# Patient Record
Sex: Female | Born: 1960 | Race: Black or African American | Hispanic: No | Marital: Married | Smoking: Never smoker
Health system: Southern US, Community
[De-identification: ages and names within clinical notes are randomized; demographics above are authoritative.]

## PROBLEM LIST (undated history)

## (undated) DIAGNOSIS — R569 Unspecified convulsions: Secondary | ICD-10-CM

## (undated) DIAGNOSIS — G40909 Epilepsy, unspecified, not intractable, without status epilepticus: Secondary | ICD-10-CM

## (undated) HISTORY — PX: CYSTECTOMY: SUR359

## (undated) HISTORY — DX: Epilepsy, unspecified, not intractable, without status epilepticus: G40.909

## (undated) HISTORY — PX: EYE SURGERY: SHX253

---

## 2015-04-13 ENCOUNTER — Encounter: Payer: Self-pay | Admitting: Obstetrics & Gynecology

## 2015-04-13 ENCOUNTER — Ambulatory Visit (INDEPENDENT_AMBULATORY_CARE_PROVIDER_SITE_OTHER): Payer: PRIVATE HEALTH INSURANCE | Admitting: Obstetrics & Gynecology

## 2015-04-13 VITALS — BP 130/78 | HR 73 | Resp 16 | Ht 62.0 in | Wt 131.0 lb

## 2015-04-13 DIAGNOSIS — N390 Urinary tract infection, site not specified: Secondary | ICD-10-CM

## 2015-04-13 DIAGNOSIS — Z01419 Encounter for gynecological examination (general) (routine) without abnormal findings: Secondary | ICD-10-CM

## 2015-04-13 DIAGNOSIS — Z1151 Encounter for screening for human papillomavirus (HPV): Secondary | ICD-10-CM

## 2015-04-13 DIAGNOSIS — Z124 Encounter for screening for malignant neoplasm of cervix: Secondary | ICD-10-CM | POA: Diagnosis not present

## 2015-04-13 LAB — POCT URINALYSIS DIPSTICK
Bilirubin, UA: NEGATIVE
GLUCOSE UA: NEGATIVE
Ketones, UA: NEGATIVE
NITRITE UA: NEGATIVE
Spec Grav, UA: 1.01
UROBILINOGEN UA: 0.2
pH, UA: 7

## 2015-04-13 MED ORDER — SULFAMETHOXAZOLE-TRIMETHOPRIM 800-160 MG PO TABS: 1.0000 | ORAL_TABLET | Freq: Two times a day (BID) | ORAL | Status: DC

## 2015-04-13 MED ORDER — FLUCONAZOLE 150 MG PO TABS: 150.0000 mg | ORAL_TABLET | Freq: Once | ORAL | Status: DC

## 2015-04-13 NOTE — Patient Instructions (Signed)

## 2015-04-13 NOTE — Progress Notes (Signed)
Patient ID: [place], female   DOB: [birth year], 54 y.o.   MRN: [id number] Pt here for routine GYN exam. Recently moved to this area from [place]. Last pap 2 years ago - normal. Never had abnormal pap. Current complaints today are UTI/Yeast infection Sx - used Monistat with no relief. Sx are lower abd pain, lower back pain, burning with urination, frequent urination. Urine dip shows Moderate blood and Leuks - will send for culture.

## 2015-04-13 NOTE — Progress Notes (Signed)
Patient ID: [place], female   DOB: Aug 11, 1961, 54 y.o.   MRN: [id number] Subjective:     H[place] is a 54 y.o. female here for a routine exam.  Current complaints: lower pelvic pressure and mild back pain for 3 weeks.  LMP: over 1 year ago.  Pt denies vaginal dryness. No mood changes.  Very mild hot flashes.    Gynecologic History No LMP recorded. Patient is postmenopausal. Contraception: post menopausal status Last Pap: 2 year. Results were: normal Last mammogram: 6 months prev. Results were: normal prev had cyst on breast and was having tests q 6 months.  Obstetric History OB History  Gravida Para Term Preterm AB SAB TAB Ectopic Multiple Living  2 2 2       2     # Outcome Date GA Lbr Len/2nd Weight Sex Delivery Anes PTL Lv  2 Term      CS-Unspec   Y  1 Term      Vag-Spont   Y     Past Medical History  Diagnosis Date  . Seizure disorder     Last one in 1994   Past Surgical History  Procedure Laterality Date  . Cystectomy Right     Right ear    Med: see current list  The following portions of the patient's history were reviewed and updated as appropriate: allergies, current medications, past family history, past medical history, past social history, past surgical history and problem list.  Review of Systems Pertinent items are noted in HPI.    Objective:    BP 130/78 mmHg  Pulse 73  Resp 16  Ht 5\' 2"  (1.575 m)  Wt 131 lb (59.421 kg)  BMI 23.95 kg/m2  General Appearance:    Alert, cooperative, no distress, appears stated age  Head:    Normocephalic, without obvious abnormality, atraumatic  Eyes:   both eyes  conjunctiva/corneas clear, EOM's intact,   Ears:    external ear canals normal, both ears  Nose:   Nares normal, septum midline  Throat:   Lips, mucosa, and tongue normal; teeth and gums normal  Neck:   Supple, symmetrical, trachea midline, no adenopathy;    thyroid:  no enlargement/tenderness/nodules  Back:     Symmetric, no curvature, ROM normal,  no CVA tenderness  Lungs:     Clear to auscultation bilaterally, respirations unlabored  Chest Wall:    No tenderness or deformity   Heart:    Regular rate and rhythm, S1 and S2 normal, no murmur, rub   or gallop  Breast Exam:    No tenderness, masses, or nipple abnormality  Abdomen:     Soft, non-tender, bowel sounds active all four quadrants,    no masses, no organomegaly  Genitalia:    Normal female without lesion, discharge or tenderness     Extremities:   Extremities normal, atraumatic, no cyanosis or edema  Pulses:   2+ and symmetric all extremities  Skin:   Skin color, texture, turgor normal, no rashes or lesions           Urine dipstick shows positive for RBC's and positive for leukocytes.     Assessment:    Healthy female exam.   UTI   Plan:    Follow up in: 1 year.    Bactrim DS 1 po bid x 3 days Mammogram next year Diflucan 1 po prn yeast infxn after atbx Urine cx sent  Alhaji Mcneal L. Harraway-Smith, M.D., [place]lynn June

## 2015-04-14 LAB — CYTOLOGY - PAP

## 2015-04-16 LAB — CULTURE, URINE COMPREHENSIVE: Colony Count: >=100000

## 2015-04-18 ENCOUNTER — Encounter: Payer: Self-pay | Admitting: Obstetrics & Gynecology

## 2015-05-03 ENCOUNTER — Ambulatory Visit (INDEPENDENT_AMBULATORY_CARE_PROVIDER_SITE_OTHER): Payer: PRIVATE HEALTH INSURANCE | Admitting: Primary Care

## 2015-05-03 ENCOUNTER — Ambulatory Visit (INDEPENDENT_AMBULATORY_CARE_PROVIDER_SITE_OTHER)
Admission: RE | Admit: 2015-05-03 | Discharge: 2015-05-03 | Disposition: A | Discharge status: Home / Self Care | Payer: PRIVATE HEALTH INSURANCE | Source: Ambulatory Visit | Attending: Primary Care | Admitting: Primary Care

## 2015-05-03 ENCOUNTER — Encounter: Payer: Self-pay | Admitting: Primary Care

## 2015-05-03 VITALS — BP 122/78 | HR 62 | Temp 97.8°F | Ht 62.0 in | Wt 129.4 lb

## 2015-05-03 DIAGNOSIS — Z8669 Personal history of other diseases of the nervous system and sense organs: Secondary | ICD-10-CM

## 2015-05-03 DIAGNOSIS — Z87898 Personal history of other specified conditions: Secondary | ICD-10-CM

## 2015-05-03 DIAGNOSIS — R109 Unspecified abdominal pain: Secondary | ICD-10-CM

## 2015-05-03 DIAGNOSIS — G40909 Epilepsy, unspecified, not intractable, without status epilepticus: Secondary | ICD-10-CM | POA: Insufficient documentation

## 2015-05-03 LAB — POCT URINALYSIS DIPSTICK
BILIRUBIN UA: NEGATIVE
GLUCOSE UA: NEGATIVE
KETONES UA: NEGATIVE
Leukocytes, UA: NEGATIVE
Nitrite, UA: NEGATIVE
Protein, UA: NEGATIVE
Urobilinogen, UA: NEGATIVE
pH, UA: 6

## 2015-05-03 NOTE — Addendum Note (Signed)
Addended by: Jacqualin Combes on: 05/03/2015 10:04 AM   Modules accepted: Orders

## 2015-05-03 NOTE — Patient Instructions (Addendum)
Complete lab work prior to leaving today. I will notify you of your results.  Complete xray(s) prior to leaving today. I will contact you regarding your results.  Please schedule a physical with me in the next 3 months. You will also schedule a lab only appointment one week prior. We will discuss your lab results during your physical.  It was a pleasure to meet you today! Please don't hesitate to call me with any questions. Welcome to [facility]!

## 2015-05-03 NOTE — Progress Notes (Signed)
Subjective:    Patient ID: Hannah [place], female    DOB: [birth year], 54 y.o.   MRN: [id number]  HPI  H[place] is a 54 year old female who presents today to establish care and discuss the problems mentioned below. Will obtain old records.  1) Urinary Tract Infection: She presented to her GYN 10 days and was diagnosed with a UTI. She was initiated on Bactrim DS tablets and completed the course. Overall her symptoms have subsided but she continues to experience bilateral low back pain with some nausea. She's been taking advil with relief. Denies fevers, dysuria, hematuria, frequency.  2) Seizures: Diagnosed in early 1990's. Last seizure in 1994 and has been managed on Tegretol since. She has not had tegretol levels checked in 2 years. She has not seen a neurologist in over 5 years. She would like to continue her Tegretol as she is nervous she will experience another seizure. Denies dizziness, headaches.  Review of Systems  Constitutional: Negative for unexpected weight change.  HENT: Negative for rhinorrhea.   Respiratory: Negative for cough and shortness of breath.   Cardiovascular: Negative for chest pain.  Gastrointestinal: Negative for diarrhea and constipation.  Genitourinary: Negative for difficulty urinating.  Musculoskeletal: Negative for myalgias and arthralgias.  Skin: Negative for rash.  Neurological: Negative for dizziness, numbness and headaches.  Psychiatric/Behavioral:       Denies concerns for anxiety or depression       Past Medical History  Diagnosis Date  . Seizure disorder     Last one in [place] History  . Marital Status: Married    Spouse Name: N/A  . Number of Children: N/A  . Years of Education: N/A   Occupational History  . Not on file.   Social History Main Topics  . Smoking status: Never Smoker   . Smokeless tobacco: Not on file  . Alcohol Use: No  . Drug Use: No  . Sexual Activity:    Partners: Male    Birth  Control/ Protection: Post-menopausal   Other Topics Concern  . Not on file   Social History Narrative   Moved from [place] to care for her mother.   Married.   2 daughters.   Highest level of education [employer].   Works doing taxes.    Enjoys exercising, dance, painting.    Past Surgical History  Procedure Laterality Date  . Cystectomy Right     Right ear     Family History  Problem Relation Age of Onset  . Hypertension Mother   . Diabetes Maternal Grandmother     Allergies  Allergen Reactions  . Penicillins Hives    Current Outpatient Prescriptions on File Prior to Visit  Medication Sig Dispense Refill  . Calcium Carbonate (CALCIUM 600 PO) Take by mouth 2 (two) times daily.    . carbamazepine (TEGRETOL) 100 MG chewable tablet Chew 100 mg by mouth 2 (two) times daily. 1 1/2 tabs daily    . cyanocobalamin 500 MCG tablet Take 500 mcg by mouth daily.    . Flaxseed, Linseed, (HM FLAXSEED OIL) 1000 MG CAPS Take by mouth.    . Multiple Vitamin (MULTIVITAMIN) tablet Take 1 tablet by mouth daily.    Marland Kitchen pyridoxine (B-6) 100 MG tablet Take 100 mg by mouth daily.    Marland Kitchen sulfamethoxazole-trimethoprim (BACTRIM DS,SEPTRA DS) 800-160 MG per tablet Take 1 tablet by mouth 2 (two) times daily. 10 tablet 0  . fluconazole (DIFLUCAN) 150  MG tablet Take 1 tablet (150 mg total) by mouth once. (Patient not taking: Reported on 05/03/2015) 1 tablet 0   No current facility-administered medications on file prior to visit.    BP 122/78 mmHg  Pulse 62  Temp(Src) 97.8 F (36.6 C) (Oral)  Ht 5\' 2"  (1.575 m)  Wt 129 lb 6.4 oz (58.695 kg)  BMI 23.66 kg/m2  SpO2 99%    Objective:   Physical Exam  Constitutional: She appears well-nourished.  Cardiovascular: Normal rate and regular rhythm.   Pulmonary/Chest: Effort normal and breath sounds normal.  Abdominal: Soft. Normal appearance and bowel sounds are normal. There is no tenderness. There is no CVA tenderness.  Skin: Skin is warm and  dry.          Assessment & Plan:  Flank pain:  UA negative for leuks, nitrites, glucose. Trace blood. Low back pain with nausea and UA with blood. Will obtain KUB to rule out renal stone. She is to notify me if pain becomes worse, develops fevers.

## 2015-05-03 NOTE — Assessment & Plan Note (Signed)
History of since 1990. Last seizure was in 1994.  She is managed currently on Tegretol and has been on since 1994. She would like to continue this medication as she is fearful of re-occuring seizure as she drives a lot. Discussed current guidelines regarding removal of medication. Will obtain tegretol level and refill medication. Will consider referral to neurology if needed.

## 2015-05-03 NOTE — Progress Notes (Signed)
Pre visit review using our clinic review tool, if applicable. No additional management support is needed unless otherwise documented below in the visit note. 

## 2015-05-04 LAB — URINE CULTURE

## 2015-05-04 LAB — CARBAMAZEPINE LEVEL, TOTAL: Carbamazepine Lvl: 4.5 mg/L (ref 4.0–12.0)

## 2015-05-05 ENCOUNTER — Other Ambulatory Visit: Payer: Self-pay | Admitting: Primary Care

## 2015-05-05 ENCOUNTER — Encounter: Payer: Self-pay | Admitting: Primary Care

## 2015-05-05 DIAGNOSIS — Z1231 Encounter for screening mammogram for malignant neoplasm of breast: Secondary | ICD-10-CM

## 2015-05-24 ENCOUNTER — Other Ambulatory Visit: Payer: Self-pay | Admitting: Primary Care

## 2015-05-24 DIAGNOSIS — Z1231 Encounter for screening mammogram for malignant neoplasm of breast: Secondary | ICD-10-CM

## 2015-05-26 ENCOUNTER — Ambulatory Visit
Admission: RE | Admit: 2015-05-26 | Discharge: 2015-05-26 | Disposition: A | Discharge status: Home / Self Care | Payer: No Typology Code available for payment source | Source: Ambulatory Visit | Attending: Primary Care | Admitting: Primary Care

## 2015-05-26 DIAGNOSIS — Z1231 Encounter for screening mammogram for malignant neoplasm of breast: Secondary | ICD-10-CM | POA: Diagnosis not present

## 2015-05-27 ENCOUNTER — Other Ambulatory Visit: Payer: Self-pay | Admitting: Primary Care

## 2015-05-27 ENCOUNTER — Encounter: Payer: Self-pay | Admitting: Primary Care

## 2015-05-27 DIAGNOSIS — Z87898 Personal history of other specified conditions: Secondary | ICD-10-CM

## 2015-05-27 MED ORDER — CARBAMAZEPINE 100 MG PO CHEW: CHEWABLE_TABLET | ORAL | Status: DC

## 2015-05-31 ENCOUNTER — Other Ambulatory Visit: Payer: Self-pay | Admitting: Internal Medicine

## 2015-05-31 DIAGNOSIS — R928 Other abnormal and inconclusive findings on diagnostic imaging of breast: Secondary | ICD-10-CM

## 2015-06-08 ENCOUNTER — Ambulatory Visit (INDEPENDENT_AMBULATORY_CARE_PROVIDER_SITE_OTHER): Payer: PRIVATE HEALTH INSURANCE

## 2015-06-08 DIAGNOSIS — Z23 Encounter for immunization: Secondary | ICD-10-CM

## 2015-06-13 ENCOUNTER — Ambulatory Visit
Admission: RE | Admit: 2015-06-13 | Discharge: 2015-06-13 | Disposition: A | Discharge status: Home / Self Care | Payer: No Typology Code available for payment source | Source: Ambulatory Visit | Attending: Internal Medicine | Admitting: Internal Medicine

## 2015-06-13 DIAGNOSIS — R928 Other abnormal and inconclusive findings on diagnostic imaging of breast: Secondary | ICD-10-CM

## 2015-06-13 DIAGNOSIS — N6489 Other specified disorders of breast: Secondary | ICD-10-CM | POA: Insufficient documentation

## 2015-06-17 ENCOUNTER — Other Ambulatory Visit: Payer: Self-pay | Admitting: Primary Care

## 2015-06-17 DIAGNOSIS — Z13220 Encounter for screening for lipoid disorders: Secondary | ICD-10-CM

## 2015-06-17 DIAGNOSIS — Z1321 Encounter for screening for nutritional disorder: Secondary | ICD-10-CM

## 2015-06-17 DIAGNOSIS — Z0000 Encounter for general adult medical examination without abnormal findings: Secondary | ICD-10-CM

## 2015-06-17 DIAGNOSIS — Z1329 Encounter for screening for other suspected endocrine disorder: Secondary | ICD-10-CM

## 2015-06-20 ENCOUNTER — Other Ambulatory Visit (INDEPENDENT_AMBULATORY_CARE_PROVIDER_SITE_OTHER): Payer: PRIVATE HEALTH INSURANCE

## 2015-06-20 DIAGNOSIS — Z13220 Encounter for screening for lipoid disorders: Secondary | ICD-10-CM

## 2015-06-20 DIAGNOSIS — Z1159 Encounter for screening for other viral diseases: Secondary | ICD-10-CM

## 2015-06-20 DIAGNOSIS — Z1329 Encounter for screening for other suspected endocrine disorder: Secondary | ICD-10-CM

## 2015-06-20 DIAGNOSIS — Z1321 Encounter for screening for nutritional disorder: Secondary | ICD-10-CM

## 2015-06-20 DIAGNOSIS — Z0000 Encounter for general adult medical examination without abnormal findings: Secondary | ICD-10-CM

## 2015-06-20 LAB — COMPREHENSIVE METABOLIC PANEL
ALBUMIN: 4.5 g/dL (ref 3.6–5.1)
ALK PHOS: 66 U/L (ref 33–130)
ALT: 18 U/L (ref 6–29)
AST: 21 U/L (ref 10–35)
BILIRUBIN TOTAL: 0.5 mg/dL (ref 0.2–1.2)
BUN: 14 mg/dL (ref 7–25)
CALCIUM: 9.3 mg/dL (ref 8.6–10.4)
CO2: 28 mmol/L (ref 20–31)
CREATININE: 0.71 mg/dL (ref 0.50–1.05)
Chloride: 101 mmol/L (ref 98–110)
GLUCOSE: 87 mg/dL (ref 65–99)
Potassium: 4.1 mmol/L (ref 3.5–5.3)
Sodium: 138 mmol/L (ref 135–146)
Total Protein: 7.4 g/dL (ref 6.1–8.1)

## 2015-06-20 LAB — LIPID PANEL
Cholesterol: 202 mg/dL — ABNORMAL HIGH (ref 125–200)
HDL: 72 mg/dL (ref >=46)
LDL Cholesterol: 120 mg/dL (ref <130)
Total CHOL/HDL Ratio: 2.8 Ratio (ref <=5.0)
Triglycerides: 50 mg/dL (ref <150)
VLDL: 10 mg/dL (ref <30)

## 2015-06-20 LAB — TSH: TSH: 0.853 u[IU]/mL (ref 0.350–4.500)

## 2015-06-21 LAB — VITAMIN D 25 HYDROXY (VIT D DEFICIENCY, FRACTURES): Vit D, 25-Hydroxy: 43 ng/mL (ref 30–100)

## 2015-06-21 LAB — HEPATITIS C ANTIBODY: HCV Ab: NEGATIVE

## 2015-06-23 ENCOUNTER — Ambulatory Visit (INDEPENDENT_AMBULATORY_CARE_PROVIDER_SITE_OTHER): Payer: PRIVATE HEALTH INSURANCE | Admitting: Primary Care

## 2015-06-23 ENCOUNTER — Encounter: Payer: Self-pay | Admitting: Primary Care

## 2015-06-23 ENCOUNTER — Other Ambulatory Visit (HOSPITAL_COMMUNITY)
Admission: RE | Admit: 2015-06-23 | Discharge: 2015-06-23 | Disposition: A | Discharge status: Home / Self Care | Payer: No Typology Code available for payment source | Source: Ambulatory Visit | Attending: Family Medicine | Admitting: Family Medicine

## 2015-06-23 ENCOUNTER — Other Ambulatory Visit: Payer: Self-pay | Admitting: Primary Care

## 2015-06-23 VITALS — BP 116/72 | HR 72 | Temp 98.2°F | Ht 62.0 in | Wt 129.8 lb

## 2015-06-23 DIAGNOSIS — E785 Hyperlipidemia, unspecified: Secondary | ICD-10-CM

## 2015-06-23 DIAGNOSIS — Z124 Encounter for screening for malignant neoplasm of cervix: Secondary | ICD-10-CM | POA: Diagnosis not present

## 2015-06-23 DIAGNOSIS — Z01419 Encounter for gynecological examination (general) (routine) without abnormal findings: Secondary | ICD-10-CM | POA: Insufficient documentation

## 2015-06-23 DIAGNOSIS — Z0000 Encounter for general adult medical examination without abnormal findings: Secondary | ICD-10-CM | POA: Insufficient documentation

## 2015-06-23 DIAGNOSIS — Z1211 Encounter for screening for malignant neoplasm of colon: Secondary | ICD-10-CM

## 2015-06-23 NOTE — Assessment & Plan Note (Addendum)
Tetanus and influenza UTD. Pap completed today. IFOB testing picked up by patient who will return soon. Discussed to start calcium with vitamin D as she is post menopausal.  Exam unremarkable. Labs with slight elevation of cholesterol. Discussed importance of healthy diet and exercise. Follow up in 1 year or sooner if needed.

## 2015-06-23 NOTE — Assessment & Plan Note (Signed)
TC slightly above goal at 202. Will have her work on improving diet and exercise. Will repeat 1 one year.

## 2015-06-23 NOTE — Addendum Note (Signed)
Addended by: Jacqualin Combes on: 06/23/2015 04:08 PM   Modules accepted: Orders

## 2015-06-23 NOTE — Progress Notes (Signed)
Pre visit review using our clinic review tool, if applicable. No additional management support is needed unless otherwise documented below in the visit note. 

## 2015-06-23 NOTE — Patient Instructions (Addendum)
Your cholesterol is slightly elevated. Work to increase fresh fruits and vegetables.   Increase water consumption. You need about 2 liters a day.  Continue your efforts of exercising.  Start taking a calcium and vitamin D supplement. You need 1200 mg of calcium and 800 units of vitamin D. This may be purchased over the counter.   Stop by the lab and pick up your stool kit. Bring it back as instructed.  Follow up in 1 year for repeat physical or sooner if needed.  It was a pleasure to see you today!

## 2015-06-23 NOTE — Addendum Note (Signed)
Addended by: Jacqualin Combes on: 06/23/2015 03:54 PM   Modules accepted: Orders

## 2015-06-23 NOTE — Addendum Note (Signed)
Addended by: Jacqualin Combes on: 06/23/2015 03:56 PM   Modules accepted: Orders

## 2015-06-23 NOTE — Progress Notes (Signed)
Subjective:    Patient ID: [place], female    DOB: 1961-08-11, 54 y.o.   MRN: [id number]  HPI  [place] is a 54 year old female who presents today for complete physical.  Immunizations: -Tetanus: Completed in May 2007. -Influenza: Completed in October 2016  Diet: Endorses a healthy diet: Breakfast: Egg, sausage, coffee, oatmeal Lunch: Wraps, left overs from dinner, yogurt Dinner: Chicken, fish, limited beef, vegetables Snacks: Vegetables, almonds Desserts: Occasionally Beverages: Limited water, coffee, sweet tea, some juice Exercise: She works out 3-4 days a week at [employer]. Eye exam: Completed 1 year ago.  Dental exam: Completed 1 year ago. Colonoscopy: Has never completed but has done annual IFOB testing. Dexa: Completed 2 years ago.  Pap Smear: Completed in 2013, due today. Mammogram: Completed in October 2016.   Review of Systems  HENT: Negative for rhinorrhea.   Respiratory: Negative for shortness of breath.   Cardiovascular: Negative for chest pain.  Gastrointestinal: Negative for diarrhea and constipation.  Genitourinary: Negative for difficulty urinating.  Musculoskeletal: Negative for myalgias and arthralgias.  Skin: Negative for rash.  Neurological: Negative for dizziness, numbness and headaches.  Psychiatric/Behavioral:       Denies concerns for anxiety or depression       Past Medical History  Diagnosis Date  . Seizure disorder ([place])     Last one in 1994    Social History   Social History  . Marital Status: Married    Spouse Name: N/A  . Number of Children: N/A  . Years of Education: N/A   Occupational History  . Not on file.   Social History Main Topics  . Smoking status: Never Smoker   . Smokeless tobacco: Not on file  . Alcohol Use: No  . Drug Use: No  . Sexual Activity:    Partners: Male    Birth Control/ Protection: Post-menopausal   Other Topics Concern  . Not on file   Social History Narrative   Moved from  [place] to care for her mother.   Married.   2 daughters.   Highest level of education [employer].   Works doing taxes.    Enjoys exercising, dance, painting.    Past Surgical History  Procedure Laterality Date  . Cystectomy Right     Right ear     Family History  Problem Relation Age of Onset  . Hypertension Mother   . Diabetes Maternal Grandmother   . Breast cancer Neg Hx     Allergies  Allergen Reactions  . Penicillins Hives    Current Outpatient Prescriptions on File Prior to Visit  Medication Sig Dispense Refill  . Calcium Carbonate (CALCIUM 600 PO) Take by mouth 2 (two) times daily.    . carbamazepine (TEGRETOL) 100 MG chewable tablet Chew 1 and 1/2 tablets by mouth daily. 60 tablet 5  . cyanocobalamin 500 MCG tablet Take 500 mcg by mouth daily.    . Flaxseed, Linseed, (HM FLAXSEED OIL) 1000 MG CAPS Take by mouth.    . Multiple Vitamin (MULTIVITAMIN) tablet Take 1 tablet by mouth daily.    Marland Kitchen pyridoxine (B-6) 100 MG tablet Take 100 mg by mouth daily.     No current facility-administered medications on file prior to visit.    BP 116/72 mmHg  Pulse 72  Temp(Src) 98.2 F (36.8 C) (Oral)  Ht 5\' 2"  (1.575 m)  Wt 129 lb 12.8 oz (58.877 kg)  BMI 23.73 kg/m2  SpO2 98%    Objective:  Physical Exam  Constitutional: She is oriented to person, place, and time. She appears well-nourished.  HENT:  Right Ear: Tympanic membrane and ear canal normal.  Left Ear: Tympanic membrane and ear canal normal.  Nose: Nose normal.  Mouth/Throat: Oropharynx is clear and moist.  Eyes: Conjunctivae and EOM are normal. Pupils are equal, round, and reactive to light.  Neck: Neck supple. No thyromegaly present.  Cardiovascular: Normal rate and regular rhythm.   Pulmonary/Chest: Effort normal and breath sounds normal.  Abdominal: Soft. Bowel sounds are normal.  Genitourinary: Cervix exhibits no motion tenderness and no discharge. Right adnexum displays no tenderness. Left  adnexum displays no tenderness. No vaginal discharge found.  Musculoskeletal: Normal range of motion.  Lymphadenopathy:    She has no cervical adenopathy.  Neurological: She is alert and oriented to person, place, and time. She has normal reflexes. No cranial nerve deficit.  Skin: Skin is warm and dry.  Psychiatric: She has a normal mood and affect.          Assessment & Plan:

## 2015-06-28 LAB — CYTOLOGY - PAP

## 2015-07-11 ENCOUNTER — Other Ambulatory Visit: Payer: Self-pay | Admitting: Primary Care

## 2015-07-11 DIAGNOSIS — Z1211 Encounter for screening for malignant neoplasm of colon: Secondary | ICD-10-CM

## 2015-07-13 ENCOUNTER — Other Ambulatory Visit (INDEPENDENT_AMBULATORY_CARE_PROVIDER_SITE_OTHER): Payer: No Typology Code available for payment source

## 2015-07-13 DIAGNOSIS — Z1211 Encounter for screening for malignant neoplasm of colon: Secondary | ICD-10-CM

## 2015-07-14 LAB — FECAL OCCULT BLOOD, IMMUNOCHEMICAL: Fecal Occult Blood: NEGATIVE

## 2015-07-18 ENCOUNTER — Encounter: Payer: Self-pay | Admitting: *Deleted

## 2015-12-19 ENCOUNTER — Ambulatory Visit (INDEPENDENT_AMBULATORY_CARE_PROVIDER_SITE_OTHER): Payer: No Typology Code available for payment source | Admitting: Primary Care

## 2015-12-19 ENCOUNTER — Ambulatory Visit (INDEPENDENT_AMBULATORY_CARE_PROVIDER_SITE_OTHER)
Admission: RE | Admit: 2015-12-19 | Discharge: 2015-12-19 | Disposition: A | Discharge status: Home / Self Care | Payer: PRIVATE HEALTH INSURANCE | Source: Ambulatory Visit | Attending: Primary Care | Admitting: Primary Care

## 2015-12-19 ENCOUNTER — Encounter: Payer: Self-pay | Admitting: Primary Care

## 2015-12-19 VITALS — BP 116/70 | HR 77 | Temp 97.8°F | Ht 62.0 in | Wt 126.8 lb

## 2015-12-19 DIAGNOSIS — M79645 Pain in left finger(s): Secondary | ICD-10-CM

## 2015-12-19 NOTE — Patient Instructions (Signed)
Complete xray(s) prior to leaving today. I will notify you of your results once received.  Try applying Aspercreme to the affected joint as needed. Tylenol or ibuprofen are okay to take as needed for discomfort.  Continue epsom salt soaks as needed.  Please notify me if no improvement in the next 4 weeks.  It was a pleasure to see you today!

## 2015-12-19 NOTE — Progress Notes (Signed)
Subjective:    Patient ID: [place], female    DOB: February 18, 1961, 55 y.o.   MRN: [id number]  HPI  [place] is a 55 year old female who presents today with a chief complaint of thumb pain and stiffness. Her pain and stiffness is located to the left thumb and has been present for for the past 6 weeks. She noticed this stiffness and pain gradually after lifting weights at the gym. She has been soaking her thumb in water and epsom salt three times daily with improvement. Her thumb became stiff again once she stopped soaking. Overall her symptoms are the same. Family history of arthritis in the hands from father. Denies recent injury/trauma, swelling to joints or hands, numbness.   Review of Systems  Constitutional: Negative for fever.  Musculoskeletal: Positive for arthralgias. Negative for joint swelling.  Neurological: Negative for numbness.       Past Medical History  Diagnosis Date  . Seizure disorder ([place])     Last one in 1994     Social History   Social History  . Marital Status: Married    Spouse Name: N/A  . Number of Children: N/A  . Years of Education: N/A   Occupational History  . Not on file.   Social History Main Topics  . Smoking status: Never Smoker   . Smokeless tobacco: Not on file  . Alcohol Use: No  . Drug Use: No  . Sexual Activity:    Partners: Male    Birth Control/ Protection: Post-menopausal   Other Topics Concern  . Not on file   Social History Narrative   Moved from [place] to care for her mother.   Married.   2 daughters.   Highest level of education [employer].   Works doing taxes.    Enjoys exercising, dance, painting.    Past Surgical History  Procedure Laterality Date  . Cystectomy Right     Right ear     Family History  Problem Relation Age of Onset  . Hypertension Mother   . Diabetes Maternal Grandmother   . Breast cancer Neg Hx     Allergies  Allergen Reactions  . Penicillins Hives    Current Outpatient  Prescriptions on File Prior to Visit  Medication Sig Dispense Refill  . Calcium Carbonate (CALCIUM 600 PO) Take by mouth 2 (two) times daily.    . carbamazepine (TEGRETOL) 100 MG chewable tablet Chew 1 and 1/2 tablets by mouth daily. (Patient taking differently: Chew 100 mg by mouth daily before breakfast. Chew 1 and 1/2 tablets by mouth daily.) 60 tablet 5  . cyanocobalamin 500 MCG tablet Take 500 mcg by mouth daily.    . Flaxseed, Linseed, (HM FLAXSEED OIL) 1000 MG CAPS Take by mouth.    . Multiple Vitamin (MULTIVITAMIN) tablet Take 1 tablet by mouth daily.    Marland Kitchen pyridoxine (B-6) 100 MG tablet Take 100 mg by mouth daily.     No current facility-administered medications on file prior to visit.    BP 116/70 mmHg  Pulse 77  Temp(Src) 97.8 F (36.6 C) (Oral)  Ht 5\' 2"  (1.575 m)  Wt 126 lb 12.8 oz (57.516 kg)  BMI 23.19 kg/m2  SpO2 99%    Objective:   Physical Exam  Constitutional: She appears well-nourished.  Cardiovascular: Normal rate and regular rhythm.   Pulmonary/Chest: Effort normal and breath sounds normal.  Musculoskeletal:  Slight decrease in flexion to left thumb. No swelling to joints or hand. 2+ radial  pulse. No obvious deformity.  Skin: Skin is warm and dry.          Assessment & Plan:  Thumb Pain:  Also with stiffness x 6 weeks. Xray of left thumb today unremarkable. No lesion or deformity. Exam with slight decrease in ROM with flexion, otherwise unremarkable. Discussed to refrain from heavy weight lifting, continue epsom salt soaks, tylenol or ibuprofen PRN. If no improvement, may need to consider PT. Suspect arthritis given FH.

## 2015-12-19 NOTE — Progress Notes (Signed)
Pre visit review using our clinic review tool, if applicable. No additional management support is needed unless otherwise documented below in the visit note. 

## 2016-01-19 ENCOUNTER — Encounter: Payer: Self-pay | Admitting: Primary Care

## 2016-03-17 ENCOUNTER — Encounter: Payer: Self-pay | Admitting: Primary Care

## 2016-05-10 ENCOUNTER — Emergency Department
Admission: EM | Admit: 2016-05-10 | Discharge: 2016-05-10 | Disposition: A | Discharge status: Home / Self Care | Payer: No Typology Code available for payment source | Attending: Student | Admitting: Student

## 2016-05-10 ENCOUNTER — Encounter: Payer: Self-pay | Admitting: Emergency Medicine

## 2016-05-10 DIAGNOSIS — Y999 Unspecified external cause status: Secondary | ICD-10-CM | POA: Diagnosis not present

## 2016-05-10 DIAGNOSIS — Y92002 Bathroom of unspecified non-institutional (private) residence single-family (private) house as the place of occurrence of the external cause: Secondary | ICD-10-CM | POA: Insufficient documentation

## 2016-05-10 DIAGNOSIS — S01111A Laceration without foreign body of right eyelid and periocular area, initial encounter: Secondary | ICD-10-CM | POA: Diagnosis not present

## 2016-05-10 DIAGNOSIS — S0990XA Unspecified injury of head, initial encounter: Secondary | ICD-10-CM

## 2016-05-10 DIAGNOSIS — W01198A Fall on same level from slipping, tripping and stumbling with subsequent striking against other object, initial encounter: Secondary | ICD-10-CM | POA: Insufficient documentation

## 2016-05-10 DIAGNOSIS — W19XXXA Unspecified fall, initial encounter: Secondary | ICD-10-CM

## 2016-05-10 DIAGNOSIS — Y9301 Activity, walking, marching and hiking: Secondary | ICD-10-CM | POA: Diagnosis not present

## 2016-05-10 DIAGNOSIS — Z23 Encounter for immunization: Secondary | ICD-10-CM | POA: Insufficient documentation

## 2016-05-10 MED ORDER — LIDOCAINE-EPINEPHRINE (PF) 1 %-1:200000 IJ SOLN
30.0000 mL | Freq: Once | INTRAMUSCULAR | Status: AC
  Administered 2016-05-10: 30 mL

## 2016-05-10 MED ORDER — TETANUS-DIPHTH-ACELL PERTUSSIS 5-2.5-18.5 LF-MCG/0.5 IM SUSP
0.5000 mL | Freq: Once | INTRAMUSCULAR | Status: AC
  Administered 2016-05-10: 0.5 mL via INTRAMUSCULAR
  Filled 2016-05-10: qty 0.5

## 2016-05-10 MED ORDER — LIDOCAINE-EPINEPHRINE (PF) 1 %-1:200000 IJ SOLN
INTRAMUSCULAR | Status: AC
  Administered 2016-05-10: 30 mL
  Filled 2016-05-10: qty 30

## 2016-05-10 MED ORDER — BACITRACIN ZINC 500 UNIT/GM EX OINT
TOPICAL_OINTMENT | CUTANEOUS | Status: AC
Start: 2016-05-10 — End: 2016-05-10
  Administered 2016-05-10: 1 via CUTANEOUS
  Filled 2016-05-10: qty 0.9

## 2016-05-10 MED ORDER — BACITRACIN ZINC 500 UNIT/GM EX OINT
TOPICAL_OINTMENT | CUTANEOUS | Status: AC
  Filled 2016-05-10: qty 0.9

## 2016-05-10 NOTE — ED Triage Notes (Signed)
Pt tripped over couch going to the bathroom fell hitting head on floor. Lac noted over right eyebrow, denies any loc.

## 2016-05-10 NOTE — ED Provider Notes (Signed)
[facility] Emergency Department Provider Note   ____________________________________________   First MD Initiated Contact with Patient 05/10/16 [phone]     (approximate)  I have reviewed the triage vital signs and the nursing notes.   HISTORY  Chief Complaint Laceration    HPI Hannah Richards is a 55 y.o. female with history of seizure disorder and hyperlipidemia, not chronically and evaluated who presents for evaluation of eyebrow laceration sustained after a mechanical fall which occurred suddenly just prior to arrival, constant since onset, mild to moderate, no modifying factors. Patient reports that she had fallen aslee on the couch and woke up to go to the pbathroom. She  was walking to the bathroom when she tripped over the end of the couch and fell onto the floor hitting the right side of her head. No loss of consciousness, no severe headache, no vomiting, no neck pain. She did sustain an laceration to the right eyebrow. She denies any chest pain or difficulty breathing, she is otherwise been in her usual state of health. She denies any alcohol or drug use. She is unsure of when she last received her tetanus vaccine. She denies any vision change or pain with extraocular movements.   Past Medical History:  Diagnosis Date  . Seizure disorder ([place])    Last one in 1994    Patient Active Problem List   Diagnosis Date Noted  . Hyperlipidemia 06/23/2015  . Preventative health care 06/23/2015  . Seizure disorder ([place]) 05/03/2015    Past Surgical History:  Procedure Laterality Date  . CYSTECTOMY Right    Right ear     Prior to Admission medications   Medication Sig Start Date End Date Taking? Authorizing Provider  Calcium Carbonate (CALCIUM 600 PO) Take by mouth 2 (two) times daily.    Historical Provider, MD  carbamazepine (TEGRETOL) 100 MG chewable tablet Chew 1 and 1/2 tablets by mouth daily. Patient taking differently: Chew 100 mg by mouth daily  before breakfast. Chew 1 and 1/2 tablets by mouth daily. 05/27/15   Pleas Koch, NP  cyanocobalamin 500 MCG tablet Take 500 mcg by mouth daily.    Historical Provider, MD  Flaxseed, Linseed, (HM FLAXSEED OIL) 1000 MG CAPS Take by mouth.    Historical Provider, MD  Multiple Vitamin (MULTIVITAMIN) tablet Take 1 tablet by mouth daily.    Historical Provider, MD  pyridoxine (B-6) 100 MG tablet Take 100 mg by mouth daily.    Historical Provider, MD    Allergies Penicillins  Family History  Problem Relation Age of Onset  . Hypertension Mother   . Diabetes Maternal Grandmother   . Breast cancer Neg Hx     Social History Social History  Substance Use Topics  . Smoking status: Never Smoker  . Smokeless tobacco: Not on file  . Alcohol use No    Review of Systems Constitutional: No fever/chills Eyes: No visual changes. ENT: No sore throat. Cardiovascular: Denies chest pain. Respiratory: Denies shortness of breath. Gastrointestinal: No abdominal pain.  No nausea, no vomiting.  No diarrhea.  No constipation. Genitourinary: Negative for dysuria. Musculoskeletal: Negative for back pain. Skin: Negative for rash. Neurological: Negative for headaches, focal weakness or numbness.  10-point ROS otherwise negative.  ____________________________________________   PHYSICAL EXAM:  Vitals:   05/10/16 0214 05/10/16 0215 05/10/16 0405  BP: (!) 135/41  139/72  Pulse: 75  63  Resp: 18  16  Temp: 98.1 F (36.7 C)    TempSrc: Oral  SpO2: 100%  100%  Weight:  128 lb (58.1 kg)   Height:  5\' 2"  (1.575 m)     VITAL SIGNS: ED Triage Vitals  Enc Vitals Group     BP 05/10/16 0214 (!) 135/41     Pulse Rate 05/10/16 0214 75     Resp 05/10/16 0214 18     Temp 05/10/16 0214 98.1 F (36.7 C)     Temp Source 05/10/16 0214 Oral     SpO2 05/10/16 0214 100 %     Weight 05/10/16 0215 128 lb (58.1 kg)     Height 05/10/16 0215 5\' 2"  (1.575 m)     Head Circumference --      Peak Flow --       Pain Score 05/10/16 0215 1     Pain Loc --      Pain Edu? --      Excl. in [place]? --     Constitutional: Alert and oriented. Well appearing and in no acute distress. Eyes: Conjunctivae are normal. PERRL. EOMI. Head: 1.5 cm deep gaping linear laceration just inferior to/associated with the right lateral eyebrow. No recurrent eyes or Battle sign. Nose: No congestion/rhinnorhea. Mouth/Throat: Mucous membranes are moist.  Oropharynx non-erythematous. Neck: No stridor.  No cervical spine tenderness to palpation. Cardiovascular: Normal rate, regular rhythm. Grossly normal heart sounds.  Good peripheral circulation. Respiratory: Normal respiratory effort.  No retractions. Lungs CTAB. Gastrointestinal: Soft and nontender. No distention.  No CVA tenderness. Genitourinary: deferred Musculoskeletal: No lower extremity tenderness nor edema.  No joint effusions. No midline T or L-spine tenderness to palpation. Neurologic:  Normal speech and language. No gross focal neurologic deficits are appreciated. No gait instability. 5 out of 5 strength in bilateral upper and lower extremity, sensation intact to light touch throughout. Skin:  Skin is warm, dry and intact. No rash noted. Psychiatric: Mood and affect are normal. Speech and behavior are normal.  ____________________________________________   LABS (all labs ordered are listed, but only abnormal results are displayed)  Labs Reviewed - No data to display ____________________________________________  EKG  none ____________________________________________  RADIOLOGY  none ____________________________________________   PROCEDURES  Procedure(s) performed:   LACERATION REPAIR Performed by: Joanne Gavel Authorized by: Loura Pardon A Consent: Verbal consent obtained. Risks and benefits: risks, benefits and alternatives were discussed Consent given by: patient Patient identity confirmed: provided demographic data Prepped and Draped in  normal sterile fashion Wound explored  Laceration Location: right eyebrow  Laceration Length: 1.5 cm  No Foreign Bodies seen or palpated  Anesthesia: local infiltration  Local anesthetic: lidocaine 1% with epinephrine  Anesthetic total: 4 ml  Irrigation method: syringe Amount of cleaning: standard  Skin closure: 1 x 4-0 vicryl deep stitch, 3 x 4-0 prolene stitches superficially placed  Number of sutures: 4 total  Technique: simple interrupted  Patient tolerance: Patient tolerated the procedure well with no immediate complications.  Procedures  Critical Care performed: No  ____________________________________________   INITIAL IMPRESSION / ASSESSMENT AND PLAN / ED COURSE  Pertinent labs & imaging results that were available during my care of the patient were reviewed by me and considered in my medical decision making (see chart for details).  Shauntavia Eckland is a 55 y.o. female with history of seizure disorder and hyperlipidemia, not chronically and evaluated who presents for evaluation of eyebrow laceration sustained after a mechanical fall which occurred suddenly just prior to arrival, constant since onset, mild to moderate, no modifying factors. On exam, she is very well-appearing  and in no acute distress, vital signs stable, she is afebrile. Laceration inferior to the right eyebrow repaired with good result. The remainder of her exam is atraumatic, she has an intact neurological exam. No indication for CT head according to canadian CT head rules. Tdap Given. Discussed return precautions and need for close PCP follow-up and she is comfortable with the discharge plan. DC home.  Clinical Course     ____________________________________________   FINAL CLINICAL IMPRESSION(S) / ED DIAGNOSES  Final diagnoses:  Head injury due to trauma, initial encounter  Fall, initial encounter  Laceration of right eyebrow, initial encounter      NEW MEDICATIONS STARTED DURING  THIS VISIT:  New Prescriptions   No medications on file     Note:  This document was prepared using Dragon voice recognition software and may include unintentional dictation errors.    Joanne Gavel, MD 05/10/16 [phone]

## 2016-05-10 NOTE — ED Notes (Signed)
PT states she got up to use restroom but it was dark and she tripped over couch. States hit head on hardwood floors, denies LOC, dizziness, or blurred vision. Pt is not complaining of any pain at this time. No distress noted.

## 2016-05-16 ENCOUNTER — Ambulatory Visit (INDEPENDENT_AMBULATORY_CARE_PROVIDER_SITE_OTHER): Payer: No Typology Code available for payment source | Admitting: Primary Care

## 2016-05-16 ENCOUNTER — Encounter: Payer: Self-pay | Admitting: Primary Care

## 2016-05-16 VITALS — BP 126/70 | HR 71 | Temp 98.1°F | Ht 62.0 in | Wt 130.1 lb

## 2016-05-16 DIAGNOSIS — Z4802 Encounter for removal of sutures: Secondary | ICD-10-CM

## 2016-05-16 NOTE — Progress Notes (Signed)
Pre visit review using our clinic review tool, if applicable. No additional management support is needed unless otherwise documented below in the visit note. 

## 2016-05-16 NOTE — Patient Instructions (Signed)
Apply antibiotic ointment daily for the next 5 days to prevent infection.  Please notify us if you're interested in Cologuard.  It was a pleasure to see you today!

## 2016-05-16 NOTE — Progress Notes (Signed)
Subjective:    Patient ID: Hannah Richards, female    DOB: 1960-12-12, 55 y.o.   MRN: [id number]  HPI  [name] is a 55 year old female who presents today for removal of sutures. She was evaluated at [facility] ED on 09/21 with a chief complaint of laceration. Her laceration is to the right eyebrow after sustaining a fall just prior to arrival. She was walking to the bathroom and tripped over the end of the couch hitting the floor on the right side of her head. She was provided with a Tdap and sutures (four total sutures) one,  4-0 vicryl deep stitch, and three, prolene stiches superficially placed.   She presents today for removal of sutures. She has noticed itching, and denies redness, drainage, fevers, headaches. She's been applying OTC antibiotic ointment daily.  Review of Systems  Constitutional: Negative for fever.  Skin: Negative for color change.  Neurological: Negative for dizziness and headaches.       Past Medical History:  Diagnosis Date  . Seizure disorder ([place])    Last one in 1994     Social History   Social History  . Marital status: Married    Spouse name: N/A  . Number of children: N/A  . Years of education: N/A   Occupational History  . Not on file.   Social History Main Topics  . Smoking status: Never Smoker  . Smokeless tobacco: Not on file  . Alcohol use No  . Drug use: No  . Sexual activity: Yes    Partners: Male    Birth control/ protection: Post-menopausal   Other Topics Concern  . Not on file   Social History Narrative   Moved from [place] to care for her mother.   Married.   2 daughters.   Highest level of education [employer].   Works doing taxes.    Enjoys exercising, dance, painting.    Past Surgical History:  Procedure Laterality Date  . CYSTECTOMY Right    Right ear     Family History  Problem Relation Age of Onset  . Hypertension Mother   . Diabetes Maternal Grandmother   . Breast cancer Neg Hx     Allergies    Allergen Reactions  . Penicillins Hives    Current Outpatient Prescriptions on File Prior to Visit  Medication Sig Dispense Refill  . Calcium Carbonate (CALCIUM 600 PO) Take by mouth 2 (two) times daily.    . cyanocobalamin 500 MCG tablet Take 500 mcg by mouth daily.    . Flaxseed, Linseed, (HM FLAXSEED OIL) 1000 MG CAPS Take by mouth.    . Multiple Vitamin (MULTIVITAMIN) tablet Take 1 tablet by mouth daily.    Marland Kitchen pyridoxine (B-6) 100 MG tablet Take 100 mg by mouth daily.     No current facility-administered medications on file prior to visit.     BP 126/70   Pulse 71   Temp 98.1 F (36.7 C) (Oral)   Ht 5\' 2"  (1.575 m)   Wt 130 lb 1.9 oz (59 kg)   SpO2 97%   BMI 23.80 kg/m    Objective:   Physical Exam  Constitutional: She is oriented to person, place, and time. She appears well-nourished.  Cardiovascular: Normal rate.   Pulmonary/Chest: Effort normal.  Neurological: She is alert and oriented to person, place, and time.  Skin: Skin is warm and dry. No erythema.  Well healing laceration to right eyebrow  Assessment & Plan:  Suture Removal:  4 sutures (1 deep, 3 superficial) placed to left eye brown on 09/21 after sustaining laceration. Site appears to be healing well, no erythema, tenderness. Denies headaches/dizziness since fall. 3 superficial sutures removed today. Cleansed site with betadine solution before and after suture removal.  Instructed patient to continue with application of antibiotic ointment. Follow up PRN.  Sheral Flow, NP

## 2016-06-15 ENCOUNTER — Other Ambulatory Visit: Payer: Self-pay | Admitting: Primary Care

## 2016-06-15 DIAGNOSIS — Z87898 Personal history of other specified conditions: Secondary | ICD-10-CM

## 2016-06-28 ENCOUNTER — Encounter: Payer: Self-pay | Admitting: Primary Care

## 2016-07-16 ENCOUNTER — Encounter: Payer: Self-pay | Admitting: Primary Care

## 2016-08-01 ENCOUNTER — Encounter: Payer: Self-pay | Admitting: Primary Care

## 2016-10-30 ENCOUNTER — Telehealth: Payer: Self-pay

## 2016-10-30 NOTE — Telephone Encounter (Signed)
Please notify patient that we are happy to help her with this and strongly encourage she schedule an annual physical as it's been over 2 years.

## 2016-10-30 NOTE — Telephone Encounter (Signed)
Pt left v/m requesting cb to see if pt could come by office to pick up  Colon screening test to [facility]. Pt request cb. Last annual 06/23/15; no future appt scheduled.Please advise.

## 2016-10-31 NOTE — Telephone Encounter (Signed)
Per DPR, detail message left for patient to return my call.

## 2016-11-02 ENCOUNTER — Other Ambulatory Visit: Payer: Self-pay | Admitting: Primary Care

## 2016-11-02 DIAGNOSIS — E785 Hyperlipidemia, unspecified: Secondary | ICD-10-CM

## 2016-11-02 NOTE — Telephone Encounter (Signed)
Spoken to patient and schedule physical. This colon screening will be addressed as well during the visit.

## 2016-11-05 ENCOUNTER — Other Ambulatory Visit (INDEPENDENT_AMBULATORY_CARE_PROVIDER_SITE_OTHER): Payer: No Typology Code available for payment source

## 2016-11-05 DIAGNOSIS — E785 Hyperlipidemia, unspecified: Secondary | ICD-10-CM

## 2016-11-05 LAB — COMPREHENSIVE METABOLIC PANEL
ALK PHOS: 55 U/L (ref 33–130)
ALT: 14 U/L (ref 6–29)
AST: 18 U/L (ref 10–35)
Albumin: 4.1 g/dL (ref 3.6–5.1)
BILIRUBIN TOTAL: 0.3 mg/dL (ref 0.2–1.2)
BUN: 14 mg/dL (ref 7–25)
CO2: 23 mmol/L (ref 20–31)
Calcium: 9.1 mg/dL (ref 8.6–10.4)
Chloride: 106 mmol/L (ref 98–110)
Creat: 0.74 mg/dL (ref 0.50–1.05)
GLUCOSE: 91 mg/dL (ref 65–99)
Potassium: 4 mmol/L (ref 3.5–5.3)
Sodium: 141 mmol/L (ref 135–146)
TOTAL PROTEIN: 6.8 g/dL (ref 6.1–8.1)

## 2016-11-05 LAB — LIPID PANEL
Cholesterol: 162 mg/dL (ref <200)
HDL: 59 mg/dL (ref >50)
LDL Cholesterol: 94 mg/dL (ref <100)
Total CHOL/HDL Ratio: 2.7 Ratio (ref <5.0)
Triglycerides: 43 mg/dL (ref <150)
VLDL: 9 mg/dL (ref <30)

## 2016-11-14 ENCOUNTER — Encounter: Payer: Self-pay | Admitting: Primary Care

## 2016-11-14 ENCOUNTER — Ambulatory Visit (INDEPENDENT_AMBULATORY_CARE_PROVIDER_SITE_OTHER): Payer: No Typology Code available for payment source | Admitting: Primary Care

## 2016-11-14 VITALS — BP 116/66 | HR 44 | Temp 98.2°F | Ht 62.0 in | Wt 130.0 lb

## 2016-11-14 DIAGNOSIS — Z0000 Encounter for general adult medical examination without abnormal findings: Secondary | ICD-10-CM | POA: Diagnosis not present

## 2016-11-14 DIAGNOSIS — E785 Hyperlipidemia, unspecified: Secondary | ICD-10-CM

## 2016-11-14 DIAGNOSIS — Z1211 Encounter for screening for malignant neoplasm of colon: Secondary | ICD-10-CM

## 2016-11-14 DIAGNOSIS — G40909 Epilepsy, unspecified, not intractable, without status epilepticus: Secondary | ICD-10-CM | POA: Diagnosis not present

## 2016-11-14 NOTE — Progress Notes (Signed)
Subjective:    Patient ID: Hannah Richards, female    DOB: 08/16/1961, 56 y.o.   MRN: [id number]  HPI  Hannah Richards is a 56 year old female who presents today for complete physical.  Immunizations: -Tetanus: Completed in 2017 -Influenza: Completed in Fall 2017   Diet: She endorses a fair diet. Breakfast: Egg, sausage, protein drink, oatmeal Lunch: Rice cake with apple butter/hummus, protein drink Dinner: Pasta, meat, vegetables, starch, salad Snacks: Nuts, seeds, occasional cookie Desserts: Occasionally Beverages: Water with juice  Exercise: She goes to the gym twice weekly (yoga, classes, walking) Eye exam: Completed in 2017 Dental exam: Completes semi-annually Colonoscopy: Never completed, has historically completed hemoccult stool cards Pap Smear: Completed in 06/2015 Mammogram: Completed in November 2017 Hep C Screening: Negative in 2016   Review of Systems  Constitutional: Negative for unexpected weight change.  HENT: Negative for rhinorrhea.   Respiratory: Negative for cough and shortness of breath.   Cardiovascular: Negative for chest pain.  Gastrointestinal: Negative for constipation and diarrhea.  Genitourinary: Negative for difficulty urinating and menstrual problem.  Musculoskeletal: Negative for arthralgias and myalgias.  Skin: Negative for rash.  Allergic/Immunologic: Negative for environmental allergies.  Neurological: Negative for dizziness, numbness and headaches.  Psychiatric/Behavioral:       Attending a support group for those who have family members with Dementia. Doing well.       Past Medical History:  Diagnosis Date  . Seizure disorder ([place])    Last one in 1994     Social History   Social History  . Marital status: Married    Spouse name: N/A  . Number of children: N/A  . Years of education: N/A   Occupational History  . Not on file.   Social History Main Topics  . Smoking status: Never Smoker  . Smokeless tobacco: Never Used  .  Alcohol use No  . Drug use: No  . Sexual activity: Yes    Partners: Male    Birth control/ protection: Post-menopausal   Other Topics Concern  . Not on file   Social History Narrative   Moved from [place] to care for her mother.   Married.   2 daughters.   Highest level of education [employer].   Works doing taxes.    Enjoys exercising, dance, painting.    Past Surgical History:  Procedure Laterality Date  . CYSTECTOMY Right    Right ear     Family History  Problem Relation Age of Onset  . Hypertension Mother   . Diabetes Maternal Grandmother   . Breast cancer Neg Hx     Allergies  Allergen Reactions  . Penicillins Hives    Current Outpatient Prescriptions on File Prior to Visit  Medication Sig Dispense Refill  . Calcium Carbonate (CALCIUM 600 PO) Take by mouth 2 (two) times daily.    . cyanocobalamin 500 MCG tablet Take 500 mcg by mouth daily.    . Flaxseed, Linseed, (HM FLAXSEED OIL) 1000 MG CAPS Take by mouth.    . Multiple Vitamin (MULTIVITAMIN) tablet Take 1 tablet by mouth daily.    Marland Kitchen pyridoxine (B-6) 100 MG tablet Take 100 mg by mouth daily.     No current facility-administered medications on file prior to visit.     BP (!) 166/66   Pulse (!) 44   Temp 98.2 F (36.8 C) (Oral)   Ht 5\' 2"  (1.575 m)   Wt 130 lb (59 kg)   SpO2 99%   BMI 23.78 kg/m  Objective:   Physical Exam  Constitutional: She is oriented to person, place, and time. She appears well-nourished.  HENT:  Right Ear: Tympanic membrane and ear canal normal.  Left Ear: Tympanic membrane and ear canal normal.  Nose: Nose normal.  Mouth/Throat: Oropharynx is clear and moist.  Eyes: Conjunctivae and EOM are normal. Pupils are equal, round, and reactive to light.  Neck: Neck supple. No thyromegaly present.  Cardiovascular: Normal rate and regular rhythm.   No murmur heard. Pulmonary/Chest: Effort normal and breath sounds normal. She has no rales.  Abdominal: Soft. Bowel sounds  are normal. There is no tenderness.  Musculoskeletal: Normal range of motion.  Lymphadenopathy:    She has no cervical adenopathy.  Neurological: She is alert and oriented to person, place, and time. She has normal reflexes. No cranial nerve deficit.  Skin: Skin is warm and dry. No rash noted.  Psychiatric: She has a normal mood and affect.          Assessment & Plan:

## 2016-11-14 NOTE — Assessment & Plan Note (Signed)
Immunizations UTD. Pap and Mammogram UTD. Due for colon cancer screening, elects for stool cards which are pending. Encouraged healthy lifestyle through exercise and diet.  Exam unremarkable. Labs unremarkable. Follow up in 1 year for annual exam.

## 2016-11-14 NOTE — Assessment & Plan Note (Signed)
No seizures since 1990's. Continues to take tegretol per her preference. Continue same.

## 2016-11-14 NOTE — Patient Instructions (Signed)
Stop by the lab for your stool specimen cards.  Continue exercising. You should be getting 150 minutes of moderate intensity exercise weekly.  Continue your efforts towards a healthy diet. Increase vegetables, fruit, whole grains.  Ensure you are consuming 64 ounces of water daily.  You are due for your mammogram in November 2018.  Follow up in 1 year for your annual exam or sooner if needed.  It was a pleasure to see you today!

## 2016-11-14 NOTE — Progress Notes (Signed)
Pre visit review using our clinic review tool, if applicable. No additional management support is needed unless otherwise documented below in the visit note. 

## 2016-11-14 NOTE — Assessment & Plan Note (Signed)
Lipids stable. Continue to monitor.

## 2016-11-15 ENCOUNTER — Encounter: Payer: Self-pay | Admitting: Primary Care

## 2016-11-15 NOTE — Telephone Encounter (Signed)
Hannah Richards, please see My Chart message regarding BP. I believe her paper said 403 systolic, is that correct or was it really 166? Please notify patient and correct the reading in her chart. Thanks.

## 2016-11-25 ENCOUNTER — Encounter: Payer: Self-pay | Admitting: Primary Care

## 2016-12-04 ENCOUNTER — Encounter: Payer: Self-pay | Admitting: Primary Care

## 2016-12-07 ENCOUNTER — Telehealth: Payer: Self-pay | Admitting: Primary Care

## 2016-12-07 NOTE — Telephone Encounter (Signed)
Please notify patient that her hemoocult result came back positive for blood detection. Has she noticed any rectal bleeding? Does she have any hemorrhoids? Based off of this positive reading she needs to have a colonoscopy. Is she agreeable?

## 2016-12-10 NOTE — Telephone Encounter (Signed)
Message left for patient to return my call.  

## 2016-12-11 NOTE — Telephone Encounter (Signed)
Spoken to patient. She stated that no rectal bleeding and no hemorrhoids. Patient stated that she bileve she may have sore from wiping too much.  Patient stated that she want to think about. She will call back regarding her decision.

## 2016-12-11 NOTE — Telephone Encounter (Signed)
Pt returned your call.  

## 2016-12-11 NOTE — Telephone Encounter (Signed)
Noted  

## 2016-12-11 NOTE — Telephone Encounter (Signed)
Pt returned your call Best number [phone]

## 2016-12-24 ENCOUNTER — Encounter: Payer: Self-pay | Admitting: Primary Care

## 2016-12-24 DIAGNOSIS — Z1211 Encounter for screening for malignant neoplasm of colon: Secondary | ICD-10-CM

## 2017-01-02 ENCOUNTER — Telehealth: Payer: Self-pay | Admitting: Gastroenterology

## 2017-01-02 NOTE — Telephone Encounter (Signed)
Patient is returning your call regarding a colonoscopy °

## 2017-01-03 ENCOUNTER — Other Ambulatory Visit: Payer: Self-pay

## 2017-01-03 ENCOUNTER — Telehealth: Payer: Self-pay

## 2017-01-03 DIAGNOSIS — Z1211 Encounter for screening for malignant neoplasm of colon: Secondary | ICD-10-CM

## 2017-01-03 NOTE — Telephone Encounter (Signed)
Pt has been scheduled screening colonoscopy with Dr. Allen Norris 01/22/17 Tuesday at [facility].  Gastroenterology Pre-Procedure Review  Request Date: 01/22/17 Requesting Physician: Dr. Allen Norris  PATIENT REVIEW QUESTIONS: The patient responded to the following health history questions as indicated:    1. Are you having any GI issues? no 2. Do you have a personal history of Polyps? no 3. Do you have a family history of Colon Cancer or Polyps? no 4. Diabetes Mellitus? no 5. Joint replacements in the past 12 months?no 6. Major health problems in the past 3 months?no 7. Any artificial heart valves, MVP, or defibrillator?no    MEDICATIONS & ALLERGIES:    Patient reports the following regarding taking any anticoagulation/antiplatelet therapy:   Plavix, Coumadin, Eliquis, Xarelto, Lovenox, Pradaxa, Brilinta, or Effient? no Aspirin? no  Patient confirms/reports the following medications:  Current Outpatient Prescriptions  Medication Sig Dispense Refill  . Calcium Carbonate (CALCIUM 600 PO) Take by mouth 2 (two) times daily.    . carbamazepine (TEGRETOL) 100 MG chewable tablet     . cyanocobalamin 500 MCG tablet Take 500 mcg by mouth daily.    . Flaxseed, Linseed, (HM FLAXSEED OIL) 1000 MG CAPS Take by mouth.    . Multiple Vitamin (MULTIVITAMIN) tablet Take 1 tablet by mouth daily.    Marland Kitchen pyridoxine (B-6) 100 MG tablet Take 100 mg by mouth daily.     No current facility-administered medications for this visit.     Patient confirms/reports the following allergies:  Allergies  Allergen Reactions  . Penicillins Hives    No orders of the defined types were placed in this encounter.   AUTHORIZATION INFORMATION Primary Insurance: 1D#: Group #:  Secondary Insurance: 1D#: Group #:  SCHEDULE INFORMATION: Date: 01/22/17 Dr. Allen Norris Time: Location: [place]

## 2017-01-04 ENCOUNTER — Telehealth: Payer: Self-pay | Admitting: Gastroenterology

## 2017-01-04 NOTE — Telephone Encounter (Signed)
01/04/17 11:22 am  Spoke with Adonna at [facility] and NO prior auth required for Screening Colonoscopy [phone] / Z12.11.

## 2017-01-21 ENCOUNTER — Encounter: Payer: Self-pay | Admitting: *Deleted

## 2017-01-22 ENCOUNTER — Ambulatory Visit: Payer: No Typology Code available for payment source | Admitting: Anesthesiology

## 2017-01-22 ENCOUNTER — Encounter: Payer: Self-pay | Admitting: *Deleted

## 2017-01-22 ENCOUNTER — Ambulatory Visit
Admission: RE | Admit: 2017-01-22 | Discharge: 2017-01-22 | Disposition: A | Discharge status: Home / Self Care | Payer: No Typology Code available for payment source | Source: Ambulatory Visit | Attending: Gastroenterology | Admitting: Gastroenterology

## 2017-01-22 ENCOUNTER — Encounter
Admission: RE | Disposition: A | Discharge status: Home / Self Care | Payer: Self-pay | Source: Ambulatory Visit | Attending: Gastroenterology

## 2017-01-22 DIAGNOSIS — G40909 Epilepsy, unspecified, not intractable, without status epilepticus: Secondary | ICD-10-CM | POA: Insufficient documentation

## 2017-01-22 DIAGNOSIS — Z79899 Other long term (current) drug therapy: Secondary | ICD-10-CM | POA: Diagnosis not present

## 2017-01-22 DIAGNOSIS — D125 Benign neoplasm of sigmoid colon: Secondary | ICD-10-CM | POA: Insufficient documentation

## 2017-01-22 DIAGNOSIS — Z1211 Encounter for screening for malignant neoplasm of colon: Secondary | ICD-10-CM | POA: Diagnosis present

## 2017-01-22 DIAGNOSIS — K635 Polyp of colon: Secondary | ICD-10-CM

## 2017-01-22 HISTORY — PX: COLONOSCOPY WITH PROPOFOL: SHX5780

## 2017-01-22 HISTORY — DX: Unspecified convulsions: R56.9

## 2017-01-22 SURGERY — COLONOSCOPY WITH PROPOFOL
Anesthesia: General

## 2017-01-22 MED ORDER — PROPOFOL 10 MG/ML IV BOLUS
INTRAVENOUS | Status: DC | PRN
  Administered 2017-01-22: 10 mg via INTRAVENOUS
  Administered 2017-01-22: 30 mg via INTRAVENOUS

## 2017-01-22 MED ORDER — PROPOFOL 500 MG/50ML IV EMUL
INTRAVENOUS | Status: DC | PRN
Start: 2017-01-22 — End: 2017-01-22
  Administered 2017-01-22: 50 ug/kg/min via INTRAVENOUS

## 2017-01-22 MED ORDER — MIDAZOLAM HCL 2 MG/2ML IJ SOLN
INTRAMUSCULAR | Status: AC
  Filled 2017-01-22: qty 2

## 2017-01-22 MED ORDER — SODIUM CHLORIDE 0.9 % IV SOLN
INTRAVENOUS | Status: DC
  Administered 2017-01-22: 09:00:00 via INTRAVENOUS

## 2017-01-22 MED ORDER — MIDAZOLAM HCL 5 MG/5ML IJ SOLN
INTRAMUSCULAR | Status: DC | PRN
  Administered 2017-01-22: 2 mg via INTRAVENOUS

## 2017-01-22 MED ORDER — PROPOFOL 500 MG/50ML IV EMUL
INTRAVENOUS | Status: AC
  Filled 2017-01-22: qty 50

## 2017-01-22 MED ORDER — FENTANYL CITRATE (PF) 100 MCG/2ML IJ SOLN
INTRAMUSCULAR | Status: DC | PRN
Start: 2017-01-22 — End: 2017-01-22
  Administered 2017-01-22 (×2): 50 ug via INTRAVENOUS

## 2017-01-22 MED ORDER — EPHEDRINE SULFATE 50 MG/ML IJ SOLN
INTRAMUSCULAR | Status: DC | PRN
  Administered 2017-01-22 (×2): 10 mg via INTRAVENOUS

## 2017-01-22 MED ORDER — FENTANYL CITRATE (PF) 100 MCG/2ML IJ SOLN
INTRAMUSCULAR | Status: AC
  Filled 2017-01-22: qty 2

## 2017-01-22 MED ORDER — LIDOCAINE HCL (PF) 2 % IJ SOLN
INTRAMUSCULAR | Status: AC
  Filled 2017-01-22: qty 2

## 2017-01-22 MED ORDER — LIDOCAINE HCL (PF) 2 % IJ SOLN
INTRAMUSCULAR | Status: DC | PRN
  Administered 2017-01-22: 50 mg

## 2017-01-22 NOTE — Brief Op Note (Signed)
Descending colon polyp cbx not retrieved

## 2017-01-22 NOTE — Op Note (Signed)
[facility] Gastroenterology Patient Name: Hannah Richards Procedure Date: 01/22/2017 9:14 AM MRN: [id number] Account #: [id number] Date of Birth: 04-19-1961 Admit Type: Inpatient Age: 56 Room: [facility] ENDO ROOM 4 Gender: Female Note Status: Finalized Procedure:            Colonoscopy Indications:          Screening for colorectal malignant neoplasm Providers:            Lucilla Lame MD, MD Referring MD:         Pleas Koch (Referring MD) Medicines:            Propofol per Anesthesia Complications:        No immediate complications. Procedure:            Pre-Anesthesia Assessment:                       - Prior to the procedure, a History and Physical was                        performed, and patient medications and allergies were                        reviewed. The patient's tolerance of previous                        anesthesia was also reviewed. The risks and benefits of                        the procedure and the sedation options and risks were                        discussed with the patient. All questions were                        answered, and informed consent was obtained. Prior                        Anticoagulants: The patient has taken no previous                        anticoagulant or antiplatelet agents. ASA Grade                        Assessment: II - A patient with mild systemic disease.                        After reviewing the risks and benefits, the patient was                        deemed in satisfactory condition to undergo the                        procedure.                       After obtaining informed consent, the colonoscope was                        passed under direct vision. Throughout the procedure,  the patient's blood pressure, pulse, and oxygen                        saturations were monitored continuously. The                        Colonoscope was introduced through the anus and         advanced to the the cecum, identified by appendiceal                        orifice and ileocecal valve. The colonoscopy was                        performed without difficulty. The patient tolerated the                        procedure well. The quality of the bowel preparation                        was excellent. Findings:      The perianal and digital rectal examinations were normal.      A 3 mm polyp was found in the sigmoid colon. The polyp was sessile. The       polyp was removed with a cold biopsy forceps. Resection and retrieval       were complete.      A 3 mm polyp was found in the sigmoid colon. The polyp was sessile. The       polyp was removed with a cold biopsy forceps. Resection was complete,       but the polyp tissue was not retrieved. Impression:           - One 3 mm polyp in the sigmoid colon, removed with a                        cold biopsy forceps. Resected and retrieved.                       - One 3 mm polyp in the sigmoid colon, removed with a                        cold biopsy forceps. Complete resection. Polyp tissue                        not retrieved. Recommendation:       - Discharge patient to home.                       - Resume previous diet.                       - Continue present medications.                       - Await pathology results.                       - Repeat colonoscopy in 5 years if polyp adenoma and 10                        years if hyperplastic Procedure Code(s):    ---  Professional ---                       [phone], Colonoscopy, flexible; with biopsy, single or                        multiple Diagnosis Code(s):    --- Professional ---                       Z12.11, Encounter for screening for malignant neoplasm                        of colon                       D12.5, Benign neoplasm of sigmoid colon CPT copyright 2016 American Medical Association. All rights reserved. The codes documented in this report are preliminary and  upon coder review may  be revised to meet current compliance requirements. Lucilla Lame MD, MD 01/22/2017 9:36:47 AM This report has been signed electronically. Number of Addenda: 0 Note Initiated On: 01/22/2017 9:14 AM Scope Withdrawal Time: 0 hours 8 minutes 54 seconds  Total Procedure Duration: 0 hours 12 minutes 49 seconds       [facility]

## 2017-01-22 NOTE — H&P (Signed)
   Lucilla Lame, MD [facility] [address] [place], [place] [address] Phone: [phone] Fax : [phone]  Primary Care Physician:  Pleas Koch, NP Primary Gastroenterologist:  Dr. Allen Norris  Pre-Procedure History & Physical: HPI:  Hannah Richards is a 56 y.o. female is here for a screening colonoscopy.   Past Medical History:  Diagnosis Date  . Seizure disorder ([place])    Last one in 1994  . Seizures ([place])    last one 20 years ago    Past Surgical History:  Procedure Laterality Date  . CYSTECTOMY Right    Right ear     Prior to Admission medications   Medication Sig Start Date End Date Taking? Authorizing Provider  Calcium Carbonate (CALCIUM 600 PO) Take by mouth 2 (two) times daily.   Yes [provider]  carbamazepine (TEGRETOL) 100 MG chewable tablet  08/24/16  Yes [provider]  cyanocobalamin 500 MCG tablet Take 500 mcg by mouth daily.   Yes [provider]  Flaxseed, Linseed, (HM FLAXSEED OIL) 1000 MG CAPS Take by mouth.   Yes [provider]  Multiple Vitamin (MULTIVITAMIN) tablet Take 1 tablet by mouth daily.   Yes [provider]  pyridoxine (B-6) 100 MG tablet Take 100 mg by mouth daily.   Yes [provider]    Allergies as of 01/03/2017 - Review Complete 11/14/2016  Allergen Reaction Noted  . Penicillins Hives 04/13/2015    Family History  Problem Relation Age of Onset  . Hypertension Mother   . Diabetes Maternal Grandmother   . Breast cancer Neg Hx     Social History   Social History  . Marital status: Married    Spouse name: N/A  . Number of children: N/A  . Years of education: N/A   Occupational History  . Not on file.   Social History Main Topics  . Smoking status: Never Smoker  . Smokeless tobacco: Never Used  . Alcohol use No  . Drug use: No  . Sexual activity: Yes    Partners: Male    Birth control/ protection: Post-menopausal   Other Topics Concern  . Not on file    Social History Narrative   Moved from [place] to care for her mother.   Married.   2 daughters.   Highest level of education [employer].   Works doing taxes.    Enjoys exercising, dance, painting.    Review of Systems: See HPI, otherwise negative ROS  Physical Exam: BP 135/85   Pulse 80   Temp (!) 96.8 F (36 C) (Tympanic)   Resp 18   Ht 5\' 2"  (1.575 m)   Wt 130 lb (59 kg)   LMP 01/22/2014 (Approximate)   SpO2 98%   BMI 23.78 kg/m  General:   Alert,  pleasant and cooperative in NAD Head:  Normocephalic and atraumatic. Neck:  Supple; no masses or thyromegaly. Lungs:  Clear throughout to auscultation.    Heart:  Regular rate and rhythm. Abdomen:  Soft, nontender and nondistended. Normal bowel sounds, without guarding, and without rebound.   Neurologic:  Alert and  oriented x4;  grossly normal neurologically.  Impression/Plan: Hannah Richards is now here to undergo a screening colonoscopy.  Risks, benefits, and alternatives regarding colonoscopy have been reviewed with the patient.  Questions have been answered.  All parties agreeable.

## 2017-01-22 NOTE — Transfer of Care (Signed)
Immediate Anesthesia Transfer of Care Note  Patient: Hannah Richards  Procedure(s) Performed: Procedure(s): COLONOSCOPY WITH PROPOFOL (N/A)  Patient Location: PACU  Anesthesia Type:General  Level of Consciousness: sedated  Airway & Oxygen Therapy: Patient Spontanous Breathing and Patient connected to nasal cannula oxygen  Post-op Assessment: Report given to RN and Post -op Vital signs reviewed and stable  Post vital signs: Reviewed and stable  Last Vitals:  Vitals:   01/22/17 0833  BP: 135/85  Pulse: 80  Resp: 18  Temp: (!) 36 C    Last Pain:  Vitals:   01/22/17 0833  TempSrc: Tympanic         Complications: No apparent anesthesia complications

## 2017-01-22 NOTE — Anesthesia Postprocedure Evaluation (Signed)
Anesthesia Post Note  Patient: Actuary  Procedure(s) Performed: Procedure(s) (LRB): COLONOSCOPY WITH PROPOFOL (N/A)  Patient location during evaluation: Endoscopy Anesthesia Type: General Level of consciousness: awake and alert Pain management: pain level controlled Vital Signs Assessment: post-procedure vital signs reviewed and stable Respiratory status: spontaneous breathing and respiratory function stable Cardiovascular status: stable Anesthetic complications: no     Last Vitals:  Vitals:   01/22/17 0833 01/22/17 0940  BP: 135/85 134/68  Pulse: 80 (!) 112  Resp: 18 13  Temp: (!) 36 C (!) 35.7 C    Last Pain:  Vitals:   01/22/17 0940  TempSrc: Tympanic                 KEPHART,WILLIAM K

## 2017-01-22 NOTE — Anesthesia Post-op Follow-up Note (Cosign Needed)
Anesthesia QCDR form completed.        

## 2017-01-22 NOTE — Anesthesia Preprocedure Evaluation (Signed)
Anesthesia Evaluation  Patient identified by MRN, date of birth, ID band Patient awake    Reviewed: Allergy & Precautions, NPO status , Patient's Chart, lab work & pertinent test results  History of Anesthesia Complications Negative for: history of anesthetic complications  Airway Mallampati: II       Dental   Pulmonary neg pulmonary ROS,           Cardiovascular negative cardio ROS       Neuro/Psych Seizures - (last 20 years ago), Well Controlled,     GI/Hepatic negative GI ROS, Neg liver ROS,   Endo/Other  negative endocrine ROS  Renal/GU negative Renal ROS     Musculoskeletal   Abdominal   Peds  Hematology   Anesthesia Other Findings   Reproductive/Obstetrics                             Anesthesia Physical Anesthesia Plan  ASA: II  Anesthesia Plan: General   Post-op Pain Management:    Induction: Intravenous  PONV Risk Score and Plan: 3 and Ondansetron, Dexamethasone, Propofol, Midazolam and Treatment may vary due to age  Airway Management Planned:   Additional Equipment:   Intra-op Plan:   Post-operative Plan:   Informed Consent: I have reviewed the patients History and Physical, chart, labs and discussed the procedure including the risks, benefits and alternatives for the proposed anesthesia with the patient or authorized representative who has indicated his/her understanding and acceptance.     Plan Discussed with:   Anesthesia Plan Comments:         Anesthesia Quick Evaluation

## 2017-01-23 ENCOUNTER — Encounter: Payer: Self-pay | Admitting: Gastroenterology

## 2017-01-23 LAB — SURGICAL PATHOLOGY

## 2017-01-24 ENCOUNTER — Encounter: Payer: Self-pay | Admitting: Gastroenterology

## 2017-03-09 IMAGING — MG MM DIGITAL SCREENING BILAT W/ CAD
7 series · 7 of 7 positions shown · non-contrast
Comparison: Previous exam(s).

CLINICAL DATA: Screening.

EXAM:
DIGITAL SCREENING BILATERAL MAMMOGRAM WITH CAD

[L CC (1 of 2)]
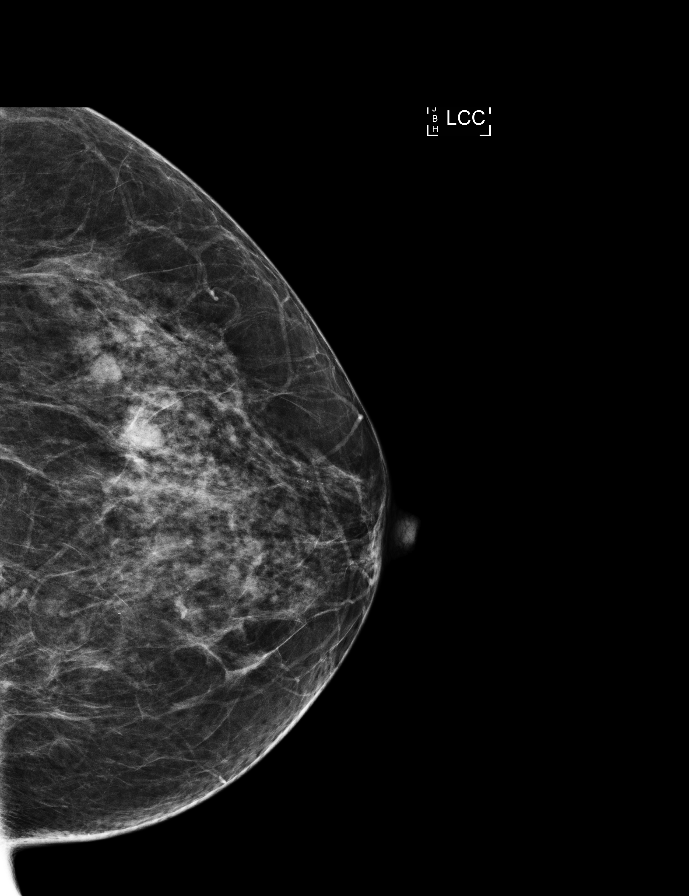

[R CC (1 of 2)]
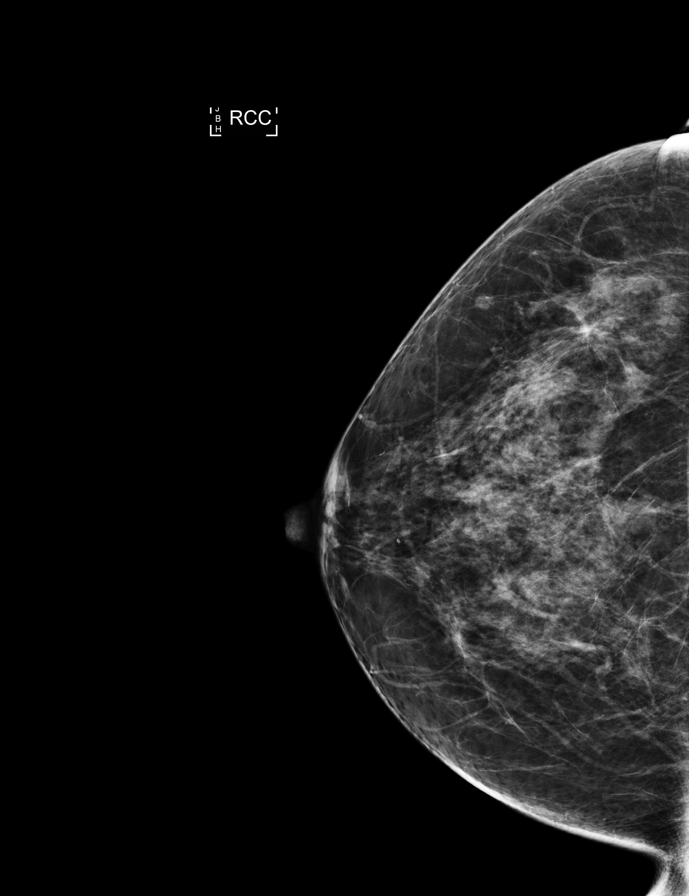

[R MLO (1 of 2)]
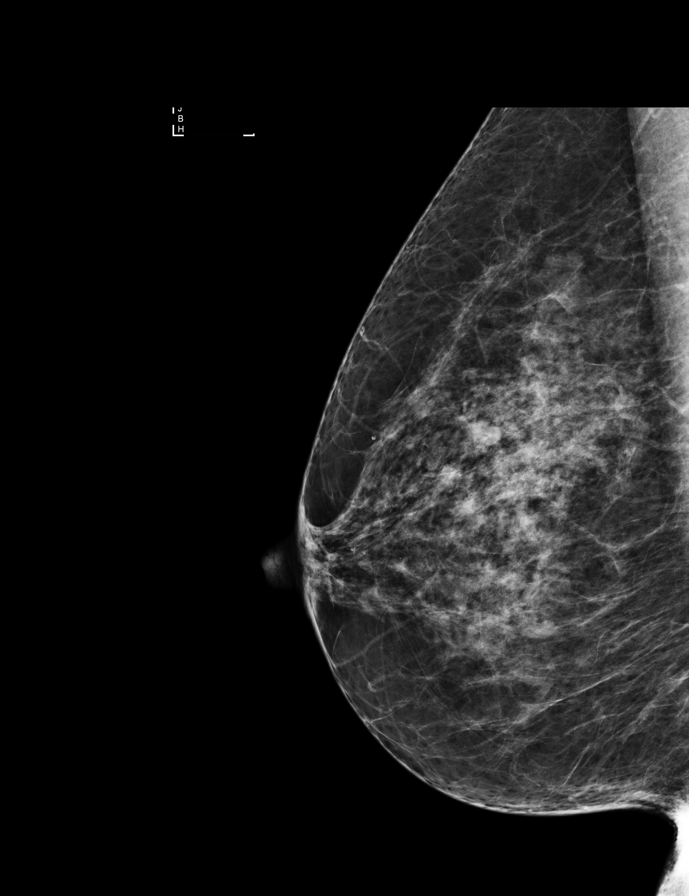

[L MLO]
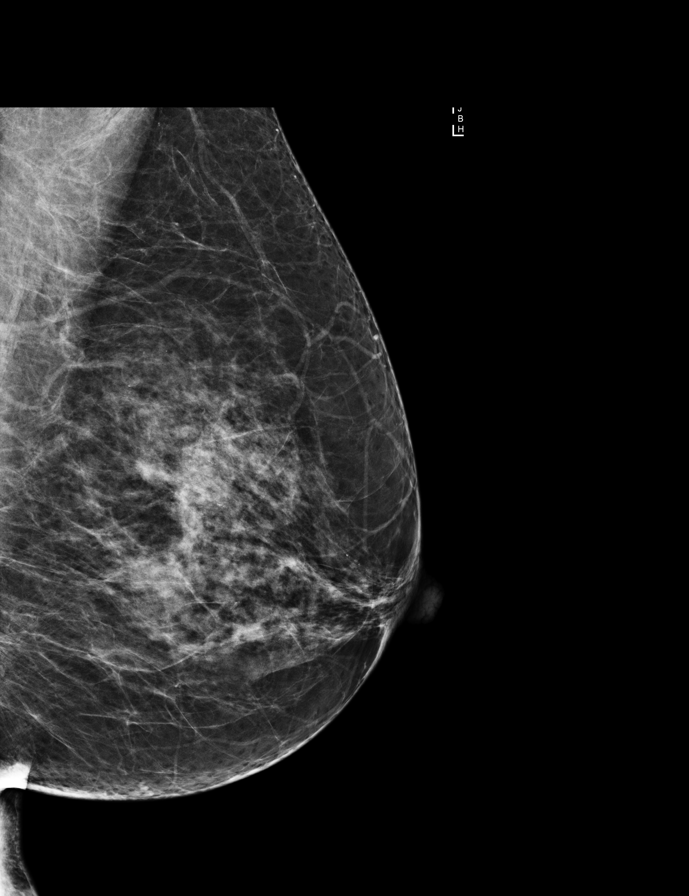

[R MLO (2 of 2)]
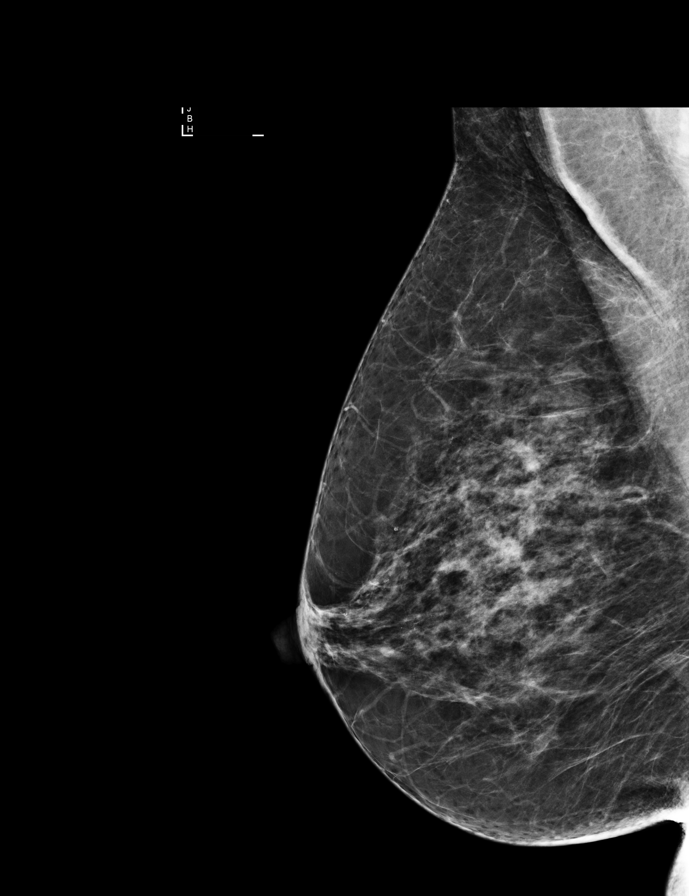

[L CC (2 of 2)]
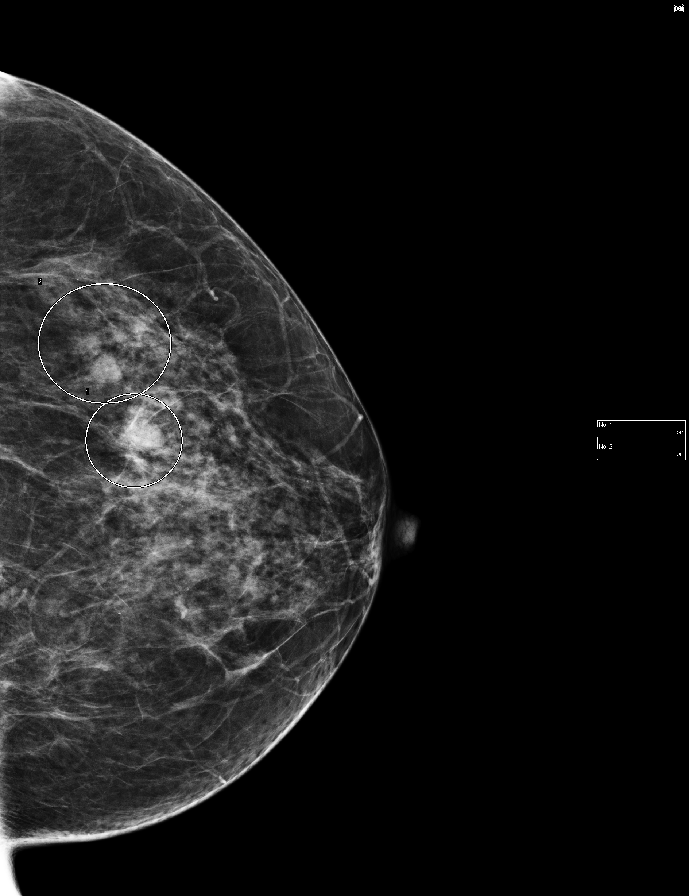

[R CC (2 of 2)]
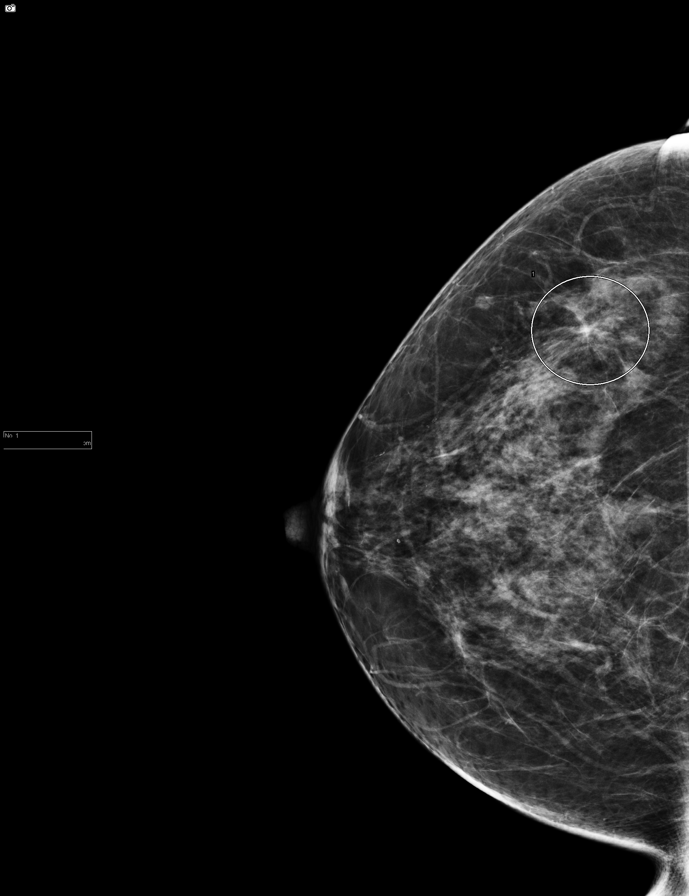

[7 of 7 positions shown; findings below may reference images not displayed]

ACR Breast Density Category c: The breast tissue is heterogeneously
dense, which may obscure small masses.
FINDINGS: In the right breast possible distortion requires further evaluation.

In the left breast possible asymmetries requires further evaluation.

Images were processed with CAD.
IMPRESSION: Further evaluation is suggested for possible distortion in the right
breast.

Further evaluation is suggested for possible asymmetry in the left
breast.

RECOMMENDATION:
Diagnostic mammogram and possibly ultrasound of both breasts.
(Code:[id number])

The patient will be contacted regarding the findings, and additional
imaging will be scheduled.

BI-RADS CATEGORY  0: Incomplete. Need additional imaging evaluation
and/or prior mammograms for comparison.

## 2017-07-09 ENCOUNTER — Encounter: Payer: Self-pay | Admitting: Primary Care

## 2017-07-09 ENCOUNTER — Other Ambulatory Visit: Payer: Self-pay | Admitting: Primary Care

## 2017-07-09 DIAGNOSIS — G40909 Epilepsy, unspecified, not intractable, without status epilepticus: Secondary | ICD-10-CM

## 2017-07-09 NOTE — Telephone Encounter (Signed)
Ok to refill? Electronically refill request for carbamazepine (TEGRETOL) 100 MG chewable tablet  Last seen on 11/14/2016

## 2017-07-09 NOTE — Telephone Encounter (Signed)
Patient says she has been taking 1 per day and they are the chewable tablets.  Patient said about a year and a half ago, she was told to take 1 per day and that is the correct dose.

## 2017-07-09 NOTE — Telephone Encounter (Signed)
Please clarify Tegretol medication and dose. Has she always been on chewables? When did she start taking 150 mg (1.5 tabs)?

## 2017-07-10 MED ORDER — CARBAMAZEPINE 100 MG PO CHEW: 100.0000 mg | CHEWABLE_TABLET | Freq: Every day | ORAL | 1 refills | Status: DC

## 2017-07-10 NOTE — Telephone Encounter (Signed)
Noted Rx sent to pharmacy  

## 2017-09-03 ENCOUNTER — Encounter: Payer: Self-pay | Admitting: Primary Care

## 2017-12-28 ENCOUNTER — Other Ambulatory Visit: Payer: Self-pay | Admitting: Primary Care

## 2017-12-28 DIAGNOSIS — G40909 Epilepsy, unspecified, not intractable, without status epilepticus: Secondary | ICD-10-CM

## 2017-12-30 NOTE — Telephone Encounter (Signed)
Ok to refill? Electronically refill request for carbamazepine (TEGRETOL) 100 MG chewable tablet  Last prescribed on 07/10/2017. Last seen on 11/14/2016.

## 2017-12-30 NOTE — Telephone Encounter (Signed)
Please notify patient that she is overdue for a follow up visit. i'll send a 30 day supply until she can be seen, will need OV for further refills.

## 2018-01-01 NOTE — Telephone Encounter (Signed)
Message left for patient to return my call.  

## 2018-01-03 NOTE — Telephone Encounter (Signed)
Message left for patient to return my call.  

## 2018-01-08 NOTE — Telephone Encounter (Signed)
Will send letter reminder patient to schedule appointment, she overdue for physical.

## 2018-01-29 ENCOUNTER — Other Ambulatory Visit: Payer: Self-pay | Admitting: Primary Care

## 2018-01-29 DIAGNOSIS — G40909 Epilepsy, unspecified, not intractable, without status epilepticus: Secondary | ICD-10-CM

## 2018-02-06 ENCOUNTER — Other Ambulatory Visit: Payer: Self-pay | Admitting: Primary Care

## 2018-02-06 DIAGNOSIS — E785 Hyperlipidemia, unspecified: Secondary | ICD-10-CM

## 2018-02-06 DIAGNOSIS — G40909 Epilepsy, unspecified, not intractable, without status epilepticus: Secondary | ICD-10-CM

## 2018-02-21 ENCOUNTER — Other Ambulatory Visit: Payer: Self-pay | Admitting: Primary Care

## 2018-02-21 DIAGNOSIS — G40909 Epilepsy, unspecified, not intractable, without status epilepticus: Secondary | ICD-10-CM

## 2018-03-05 ENCOUNTER — Other Ambulatory Visit (INDEPENDENT_AMBULATORY_CARE_PROVIDER_SITE_OTHER): Payer: No Typology Code available for payment source

## 2018-03-05 DIAGNOSIS — E785 Hyperlipidemia, unspecified: Secondary | ICD-10-CM | POA: Diagnosis not present

## 2018-03-05 DIAGNOSIS — G40909 Epilepsy, unspecified, not intractable, without status epilepticus: Secondary | ICD-10-CM

## 2018-03-06 LAB — COMPREHENSIVE METABOLIC PANEL
AG Ratio: 1.7 (calc) (ref 1.0–2.5)
ALT: 13 U/L (ref 6–29)
AST: 17 U/L (ref 10–35)
Albumin: 4.1 g/dL (ref 3.6–5.1)
Alkaline phosphatase (APISO): 58 U/L (ref 33–130)
BUN: 13 mg/dL (ref 7–25)
CALCIUM: 9 mg/dL (ref 8.6–10.4)
CO2: 27 mmol/L (ref 20–32)
CREATININE: 0.78 mg/dL (ref 0.50–1.05)
Chloride: 107 mmol/L (ref 98–110)
GLUCOSE: 85 mg/dL (ref 65–99)
Globulin: 2.4 g/dL (calc) (ref 1.9–3.7)
Potassium: 4.3 mmol/L (ref 3.5–5.3)
SODIUM: 140 mmol/L (ref 135–146)
Total Bilirubin: 0.5 mg/dL (ref 0.2–1.2)
Total Protein: 6.5 g/dL (ref 6.1–8.1)

## 2018-03-06 LAB — LIPID PANEL
Cholesterol: 162 mg/dL (ref <200)
HDL: 56 mg/dL (ref >50)
LDL CHOLESTEROL (CALC): 93 mg/dL
Non-HDL Cholesterol (Calc): 106 mg/dL (calc) (ref <130)
TRIGLYCERIDES: 45 mg/dL (ref <150)
Total CHOL/HDL Ratio: 2.9 (calc) (ref <5.0)

## 2018-03-06 LAB — CARBAMAZEPINE LEVEL, TOTAL: Carbamazepine Lvl: 2.1 mg/L — ABNORMAL LOW (ref 4.0–12.0)

## 2018-03-12 ENCOUNTER — Ambulatory Visit (INDEPENDENT_AMBULATORY_CARE_PROVIDER_SITE_OTHER): Payer: No Typology Code available for payment source | Admitting: Primary Care

## 2018-03-12 ENCOUNTER — Encounter: Payer: Self-pay | Admitting: Primary Care

## 2018-03-12 VITALS — BP 102/64 | HR 48 | Temp 98.0°F | Ht 62.0 in | Wt 133.5 lb

## 2018-03-12 DIAGNOSIS — Z0000 Encounter for general adult medical examination without abnormal findings: Secondary | ICD-10-CM

## 2018-03-12 DIAGNOSIS — G40909 Epilepsy, unspecified, not intractable, without status epilepticus: Secondary | ICD-10-CM

## 2018-03-12 DIAGNOSIS — E785 Hyperlipidemia, unspecified: Secondary | ICD-10-CM

## 2018-03-12 NOTE — Assessment & Plan Note (Signed)
Recent lipid panel normal, continue to monitor.  Commended her on an overall healthy diet.

## 2018-03-12 NOTE — Assessment & Plan Note (Signed)
Recent Tegretol level low, she is compliant. Discussed that she could likely be weaned down and off but she continues to request the medication. She has been seizure free since the 1990's.

## 2018-03-12 NOTE — Progress Notes (Signed)
Subjective:    Patient ID: Hannah Richards, female    DOB: 1961-07-15, 57 y.o.   MRN: [id number]  HPI  [name] is a 57 year old female who presents today for complete physical.  Immunizations: -Tetanus: Completed in 2017 -Influenza: Completed last season   Diet: She endorses a fair diet. Breakfast: Eggs, vegetables, wrap, oatmeal, waffles Lunch: Protein shake, nuts Dinner: Chicken, vegetables, fish, potatoes  Snacks: Popcorn, nuts, seeds Desserts: Cake, cookies. Twice weekly. Beverages: Water with un-sweet tea, sometimes half sweet.  Exercise: She does water aerobics, yoga, line dancing three times weekly. Eye exam: Completed in 2019 Dental exam: Completes semi-annually  Colonoscopy: Completed in 2018 Pap Smear: Completed in 2016, declines today Mammogram: Completed in 2017   Review of Systems  Constitutional: Negative for unexpected weight change.  HENT: Negative for rhinorrhea.   Respiratory: Negative for cough and shortness of breath.   Cardiovascular: Negative for chest pain.  Gastrointestinal: Negative for constipation and diarrhea.  Genitourinary: Negative for difficulty urinating and menstrual problem.  Musculoskeletal: Negative for arthralgias and myalgias.  Skin: Negative for rash.  Allergic/Immunologic: Negative for environmental allergies.  Neurological: Negative for dizziness, numbness and headaches.  Psychiatric/Behavioral: The patient is not nervous/anxious.        Past Medical History:  Diagnosis Date  . Seizure disorder ([place])    Last one in 1994  . Seizures ([place])    last one 20 years ago     Social History   Socioeconomic History  . Marital status: Married    Spouse name: Not on file  . Number of children: Not on file  . Years of education: Not on file  . Highest education level: Not on file  Occupational History  . Not on file  Social Needs  . Financial resource strain: Not on file  . Food insecurity:    Worry: Not on file   Inability: Not on file  . Transportation needs:    Medical: Not on file    Non-medical: Not on file  Tobacco Use  . Smoking status: Never Smoker  . Smokeless tobacco: Never Used  Substance and Sexual Activity  . Alcohol use: No    Alcohol/week: 0.0 oz  . Drug use: No  . Sexual activity: Yes    Partners: Male    Birth control/protection: Post-menopausal  Lifestyle  . Physical activity:    Days per week: Not on file    Minutes per session: Not on file  . Stress: Not on file  Relationships  . Social connections:    Talks on phone: Not on file    Gets together: Not on file    Attends religious service: Not on file    Active member of club or organization: Not on file    Attends meetings of clubs or organizations: Not on file    Relationship status: Not on file  . Intimate partner violence:    Fear of current or ex partner: Not on file    Emotionally abused: Not on file    Physically abused: Not on file    Forced sexual activity: Not on file  Other Topics Concern  . Not on file  Social History Narrative   Moved from [place] to care for her mother.   Married.   2 daughters.   Highest level of education [employer].   Works doing taxes.    Enjoys exercising, dance, painting.    Past Surgical History:  Procedure Laterality Date  . COLONOSCOPY WITH PROPOFOL  N/A 01/22/2017   Procedure: COLONOSCOPY WITH PROPOFOL;  Surgeon: Lucilla Lame, MD;  Location: [facility] ENDOSCOPY;  Service: Endoscopy;  Laterality: N/A;  . CYSTECTOMY Right    Right ear     Family History  Problem Relation Age of Onset  . Hypertension Mother   . Diabetes Maternal Grandmother   . Breast cancer Neg Hx     Allergies  Allergen Reactions  . Penicillins Hives    Current Outpatient Medications on File Prior to Visit  Medication Sig Dispense Refill  . Calcium Carbonate (CALCIUM 600 PO) Take by mouth 2 (two) times daily.    . carbamazepine (TEGRETOL) 100 MG chewable tablet CHEW 1 TABLET (100 MG  TOTAL) BY MOUTH DAILY. WILL NEED APPOINTMENT FOR ANY MORE REFILLS 30 tablet 0  . cyanocobalamin 500 MCG tablet Take 500 mcg by mouth daily.    . Flaxseed, Linseed, (HM FLAXSEED OIL) 1000 MG CAPS Take by mouth.    . Multiple Vitamin (MULTIVITAMIN) tablet Take 1 tablet by mouth daily.    Marland Kitchen pyridoxine (B-6) 100 MG tablet Take 100 mg by mouth daily.     No current facility-administered medications on file prior to visit.     BP 102/64 (BP Location: Left Arm, Patient Position: Sitting, Cuff Size: Normal)   Pulse (!) 48   Temp 98 F (36.7 C) (Oral)   Ht 5\' 2"  (1.575 m)   Wt 133 lb 8 oz (60.6 kg)   LMP 01/22/2014 (Approximate)   SpO2 95%   BMI 24.42 kg/m    Objective:   Physical Exam  Constitutional: She is oriented to person, place, and time. She appears well-nourished.  HENT:  Mouth/Throat: No oropharyngeal exudate.  Eyes: Pupils are equal, round, and reactive to light. EOM are normal.  Neck: Neck supple. No thyromegaly present.  Cardiovascular: Normal rate and regular rhythm.  Respiratory: Effort normal and breath sounds normal.  GI: Soft. Bowel sounds are normal. There is no tenderness.  Musculoskeletal: Normal range of motion.  Neurological: She is alert and oriented to person, place, and time.  Skin: Skin is warm and dry.  Psychiatric: She has a normal mood and affect.           Assessment & Plan:

## 2018-03-12 NOTE — Assessment & Plan Note (Signed)
Immunizations UTD. Pap smear due this Fall, she declines today and will schedule for this Fall. Mammogram due, she would like to double check her records as she thinks she had one last year. Colonoscopy UTD. Exam unremarkable. Labs reviewed.  Follow up in 1 year for CPE.

## 2018-03-12 NOTE — Patient Instructions (Addendum)
Based off of my records you are due for a mammogram, please double check your records. I do recommend we proceed.  Continue exercising. You should be getting 150 minutes of moderate intensity exercise weekly.  Continue to work on a healthy diet.  You are due for your pap smear this Fall, please schedule this at your convenience.   It was a pleasure to see you today!   Preventive Care 40-64 Years, Female Preventive care refers to lifestyle choices and visits with your health care provider that can promote health and wellness. What does preventive care include?  A yearly physical exam. This is also called an annual well check.  Dental exams once or twice a year.  Routine eye exams. Ask your health care provider how often you should have your eyes checked.  Personal lifestyle choices, including: ? Daily care of your teeth and gums. ? Regular physical activity. ? Eating a healthy diet. ? Avoiding tobacco and drug use. ? Limiting alcohol use. ? Practicing safe sex. ? Taking low-dose aspirin daily starting at age 52. ? Taking vitamin and mineral supplements as recommended by your health care provider. What happens during an annual well check? The services and screenings done by your health care provider during your annual well check will depend on your age, overall health, lifestyle risk factors, and family history of disease. Counseling Your health care provider may ask you questions about your:  Alcohol use.  Tobacco use.  Drug use.  Emotional well-being.  Home and relationship well-being.  Sexual activity.  Eating habits.  Work and work Statistician.  Method of birth control.  Menstrual cycle.  Pregnancy history.  Screening You may have the following tests or measurements:  Height, weight, and BMI.  Blood pressure.  Lipid and cholesterol levels. These may be checked every 5 years, or more frequently if you are over 56 years old.  Skin check.  Lung  cancer screening. You may have this screening every year starting at age 60 if you have a 30-pack-year history of smoking and currently smoke or have quit within the past 15 years.  Fecal occult blood test (FOBT) of the stool. You may have this test every year starting at age 68.  Flexible sigmoidoscopy or colonoscopy. You may have a sigmoidoscopy every 5 years or a colonoscopy every 10 years starting at age 26.  Hepatitis C blood test.  Hepatitis B blood test.  Sexually transmitted disease (STD) testing.  Diabetes screening. This is done by checking your blood sugar (glucose) after you have not eaten for a while (fasting). You may have this done every 1-3 years.  Mammogram. This may be done every 1-2 years. Talk to your health care provider about when you should start having regular mammograms. This may depend on whether you have a family history of breast cancer.  BRCA-related cancer screening. This may be done if you have a family history of breast, ovarian, tubal, or peritoneal cancers.  Pelvic exam and Pap test. This may be done every 3 years starting at age 16. Starting at age 65, this may be done every 5 years if you have a Pap test in combination with an HPV test.  Bone density scan. This is done to screen for osteoporosis. You may have this scan if you are at high risk for osteoporosis.  Discuss your test results, treatment options, and if necessary, the need for more tests with your health care provider. Vaccines Your health care provider may recommend certain vaccines, such  as:  Influenza vaccine. This is recommended every year.  Tetanus, diphtheria, and acellular pertussis (Tdap, Td) vaccine. You may need a Td booster every 10 years.  Varicella vaccine. You may need this if you have not been vaccinated.  Zoster vaccine. You may need this after age 60.  Measles, mumps, and rubella (MMR) vaccine. You may need at least one dose of MMR if you were born in 1957 or later. You  may also need a second dose.  Pneumococcal 13-valent conjugate (PCV13) vaccine. You may need this if you have certain conditions and were not previously vaccinated.  Pneumococcal polysaccharide (PPSV23) vaccine. You may need one or two doses if you smoke cigarettes or if you have certain conditions.  Meningococcal vaccine. You may need this if you have certain conditions.  Hepatitis A vaccine. You may need this if you have certain conditions or if you travel or work in places where you may be exposed to hepatitis A.  Hepatitis B vaccine. You may need this if you have certain conditions or if you travel or work in places where you may be exposed to hepatitis B.  Haemophilus influenzae type b (Hib) vaccine. You may need this if you have certain conditions.  Talk to your health care provider about which screenings and vaccines you need and how often you need them. This information is not intended to replace advice given to you by your health care provider. Make sure you discuss any questions you have with your health care provider. Document Released: 09/02/2015 Document Revised: 04/25/2016 Document Reviewed: 06/07/2015 Elsevier Interactive Patient Education  [employer].

## 2018-03-19 ENCOUNTER — Other Ambulatory Visit: Payer: Self-pay | Admitting: Primary Care

## 2018-03-19 DIAGNOSIS — Z1231 Encounter for screening mammogram for malignant neoplasm of breast: Secondary | ICD-10-CM

## 2018-04-08 ENCOUNTER — Ambulatory Visit
Admission: RE | Admit: 2018-04-08 | Discharge: 2018-04-08 | Disposition: A | Discharge status: Home / Self Care | Payer: No Typology Code available for payment source | Source: Ambulatory Visit | Attending: Primary Care | Admitting: Primary Care

## 2018-04-08 DIAGNOSIS — Z1231 Encounter for screening mammogram for malignant neoplasm of breast: Secondary | ICD-10-CM | POA: Insufficient documentation

## 2018-07-22 ENCOUNTER — Ambulatory Visit (INDEPENDENT_AMBULATORY_CARE_PROVIDER_SITE_OTHER): Payer: No Typology Code available for payment source | Admitting: Obstetrics & Gynecology

## 2018-07-22 ENCOUNTER — Encounter: Payer: Self-pay | Admitting: Obstetrics & Gynecology

## 2018-07-22 VITALS — BP 124/80 | HR 71 | Ht 62.0 in | Wt 135.0 lb

## 2018-07-22 DIAGNOSIS — Z124 Encounter for screening for malignant neoplasm of cervix: Secondary | ICD-10-CM | POA: Diagnosis not present

## 2018-07-22 DIAGNOSIS — Z01419 Encounter for gynecological examination (general) (routine) without abnormal findings: Secondary | ICD-10-CM

## 2018-07-22 DIAGNOSIS — Z1151 Encounter for screening for human papillomavirus (HPV): Secondary | ICD-10-CM | POA: Diagnosis not present

## 2018-07-22 NOTE — Progress Notes (Signed)
GYNECOLOGY ANNUAL PREVENTATIVE CARE ENCOUNTER NOTE  Subjective:   Hannah Richards is a 57 y.o. G16P2002 postmenopasual female here for a routine annual gynecologic exam.  Current complaints: None.   Denies abnormal vaginal bleeding, discharge, pelvic pain, problems with intercourse or other gynecologic concerns.    Gynecologic History Patient's last menstrual period was 01/22/2014 (approximate). Contraception: post menopausal status Last Pap: 2016. Results were: normal with negative HPV Last mammogram: 04/08/2018. Results were: normal  Obstetric History OB History  Gravida Para Term Preterm AB Living  2 2 2     2   SAB TAB Ectopic Multiple Live Births          2    # Outcome Date GA Lbr Len/2nd Weight Sex Delivery Anes PTL Lv  2 Term      CS-Unspec   LIV  1 Term      Vag-Spont   LIV    Past Medical History:  Diagnosis Date  . Seizure disorder ([place])    Last one in 1994  . Seizures ([place])    last one 20 years ago    Past Surgical History:  Procedure Laterality Date  . CESAREAN SECTION    . COLONOSCOPY WITH PROPOFOL N/A 01/22/2017   Procedure: COLONOSCOPY WITH PROPOFOL;  Surgeon: Lucilla Lame, MD;  Location[facility] END[facility]COPY;  Service: Endoscopy;  Laterality: N/A;  . CYSTECTOMY Right    Right ear     Current Outpatient Medications on File Prior to Visit  Medication Sig Dispense Refill  . Calcium Carbonate (CALCIUM 600 PO) Take by mouth 2 (two) times daily.    . carbamazepine (TEGRETOL) 100 MG chewable tablet CHEW 1 TABLET (100 MG TOTAL) BY MOUTH DAILY. WILL NEED APPOINTMENT FOR ANY MORE REFILLS 30 tablet 0  . cyanocobalamin 500 MCG tablet Take 500 mcg by mouth daily.    . Flaxseed, Linseed, (HM FLAXSEED OIL) 1000 MG CAPS Take by mouth.    . Multiple Vitamin (MULTIVITAMIN) tablet Take 1 tablet by mouth daily.    Marland Kitchen pyridoxine (B-6) 100 MG tablet Take 100 mg by mouth daily.     No current facility-administered medications on file prior to visit.     Allergies  Allergen  Reactions  . Peanut-Containing Drug Products Hives  . Penicillins Hives    Social History:  reports that she has never smoked. She has never used smokeless tobacco. She reports that she does not drink alcohol or use drugs.  Family History  Problem Relation Age of Onset  . Hypertension Mother   . Diabetes Maternal Grandmother   . Breast cancer Neg Hx     The following portions of the patient's history were reviewed and updated as appropriate: allergies, current medications, past family history, past medical history, past social history, past surgical history and problem list.  Review of Systems Pertinent items noted in HPI and remainder of comprehensive ROS otherwise negative.   Objective:  BP 124/80   Pulse 71   Ht 5\' 2"  (1.575 m)   Wt 135 lb (61.2 kg)   LMP 01/22/2014 (Approximate)   BMI 24.69 kg/m  CONSTITUTIONAL: Well-developed, well-nourished female in no acute distress.  HENT:  Normocephalic, atraumatic, External right and left ear normal. Oropharynx is clear and moist EYES: Conjunctivae and EOM are normal. Pupils are equal, round, and reactive to light. No scleral icterus.  NECK: Normal range of motion, supple, no masses.  Normal thyroid.  SKIN: Skin is warm and dry. No rash noted. Not diaphoretic. No erythema. No pallor.  MUSCULOSKELETAL: Normal range of motion. No tenderness.  No cyanosis, clubbing, or edema.  2+ distal pulses. NEUROLOGIC: Alert and oriented to person, place, and time. Normal reflexes, muscle tone coordination. No cranial nerve deficit noted. PSYCHIATRIC: Normal mood and affect. Normal behavior. Normal judgment and thought content. CARDIOVASCULAR: Normal heart rate noted, regular rhythm RESPIRATORY: Clear to auscultation bilaterally. Effort and breath sounds normal, no problems with respiration noted. BREASTS: Symmetric in size. No masses, skin changes, nipple drainage, or lymphadenopathy. ABDOMEN: Soft, normal bowel sounds, no distention noted.  No  tenderness, rebound or guarding.  PELVIC: Normal appearing external genitalia; normal appearing vaginal mucosa and cervix.  Mild atrophy noted.  No abnormal discharge noted.  Pap smear obtained.  Normal uterine size, no other palpable masses, no uterine or adnexal tenderness.   Assessment and Plan:  1. Encounter for gynecological examination with Papanicolaou smear of cervix - Cytology - PAP( [place]) Will follow up results of pap smear and manage accordingly. Routine preventative health maintenance measures emphasized. Please refer to After Visit Summary for other counseling recommendations.    Verita Schneiders, MD, [place] for [employer], [place] [facility]

## 2018-07-22 NOTE — Patient Instructions (Signed)
Preventive Care 40-64 Years, Female Preventive care refers to lifestyle choices and visits with your health care provider that can promote health and wellness. What does preventive care include?  A yearly physical exam. This is also called an annual well check.  Dental exams once or twice a year.  Routine eye exams. Ask your health care provider how often you should have your eyes checked.  Personal lifestyle choices, including: ? Daily care of your teeth and gums. ? Regular physical activity. ? Eating a healthy diet. ? Avoiding tobacco and drug use. ? Limiting alcohol use. ? Practicing safe sex. ? Taking low-dose aspirin daily starting at age 58. ? Taking vitamin and mineral supplements as recommended by your health care provider. What happens during an annual well check? The services and screenings done by your health care provider during your annual well check will depend on your age, overall health, lifestyle risk factors, and family history of disease. Counseling Your health care provider may ask you questions about your:  Alcohol use.  Tobacco use.  Drug use.  Emotional well-being.  Home and relationship well-being.  Sexual activity.  Eating habits.  Work and work Statistician.  Method of birth control.  Menstrual cycle.  Pregnancy history.  Screening You may have the following tests or measurements:  Height, weight, and BMI.  Blood pressure.  Lipid and cholesterol levels. These may be checked every 5 years, or more frequently if you are over 81 years old.  Skin check.  Lung cancer screening. You may have this screening every year starting at age 78 if you have a 30-pack-year history of smoking and currently smoke or have quit within the past 15 years.  Fecal occult blood test (FOBT) of the stool. You may have this test every year starting at age 65.  Flexible sigmoidoscopy or colonoscopy. You may have a sigmoidoscopy every 5 years or a colonoscopy  every 10 years starting at age 30.  Hepatitis C blood test.  Hepatitis B blood test.  Sexually transmitted disease (STD) testing.  Diabetes screening. This is done by checking your blood sugar (glucose) after you have not eaten for a while (fasting). You may have this done every 1-3 years.  Mammogram. This may be done every 1-2 years. Talk to your health care provider about when you should start having regular mammograms. This may depend on whether you have a family history of breast cancer.  BRCA-related cancer screening. This may be done if you have a family history of breast, ovarian, tubal, or peritoneal cancers.  Pelvic exam and Pap test. This may be done every 3 years starting at age 80. Starting at age 36, this may be done every 5 years if you have a Pap test in combination with an HPV test.  Bone density scan. This is done to screen for osteoporosis. You may have this scan if you are at high risk for osteoporosis.  Discuss your test results, treatment options, and if necessary, the need for more tests with your health care provider. Vaccines Your health care provider may recommend certain vaccines, such as:  Influenza vaccine. This is recommended every year.  Tetanus, diphtheria, and acellular pertussis (Tdap, Td) vaccine. You may need a Td booster every 10 years.  Varicella vaccine. You may need this if you have not been vaccinated.  Zoster vaccine. You may need this after age 5.  Measles, mumps, and rubella (MMR) vaccine. You may need at least one dose of MMR if you were born in  1957 or later. You may also need a second dose.  Pneumococcal 13-valent conjugate (PCV13) vaccine. You may need this if you have certain conditions and were not previously vaccinated.  Pneumococcal polysaccharide (PPSV23) vaccine. You may need one or two doses if you smoke cigarettes or if you have certain conditions.  Meningococcal vaccine. You may need this if you have certain  conditions.  Hepatitis A vaccine. You may need this if you have certain conditions or if you travel or work in places where you may be exposed to hepatitis A.  Hepatitis B vaccine. You may need this if you have certain conditions or if you travel or work in places where you may be exposed to hepatitis B.  Haemophilus influenzae type b (Hib) vaccine. You may need this if you have certain conditions.  Talk to your health care provider about which screenings and vaccines you need and how often you need them. This information is not intended to replace advice given to you by your health care provider. Make sure you discuss any questions you have with your health care provider. Document Released: 09/02/2015 Document Revised: 04/25/2016 Document Reviewed: 06/07/2015 Elsevier Interactive Patient Education  2018 Elsevier Inc.  

## 2018-07-25 LAB — CYTOLOGY - PAP
Diagnosis: NEGATIVE
HPV: NOT DETECTED

## 2019-04-06 ENCOUNTER — Other Ambulatory Visit: Payer: Self-pay | Admitting: Primary Care

## 2019-04-06 DIAGNOSIS — E785 Hyperlipidemia, unspecified: Secondary | ICD-10-CM

## 2019-04-30 ENCOUNTER — Other Ambulatory Visit: Payer: No Typology Code available for payment source

## 2019-05-07 ENCOUNTER — Encounter: Payer: No Typology Code available for payment source | Admitting: Primary Care

## 2019-05-20 ENCOUNTER — Other Ambulatory Visit (INDEPENDENT_AMBULATORY_CARE_PROVIDER_SITE_OTHER): Payer: No Typology Code available for payment source

## 2019-05-20 DIAGNOSIS — E785 Hyperlipidemia, unspecified: Secondary | ICD-10-CM

## 2019-05-20 LAB — LIPID PANEL
Cholesterol: 147 mg/dL (ref 0–200)
HDL: 55.5 mg/dL (ref >39.00)
LDL Cholesterol: 84 mg/dL (ref 0–99)
NonHDL: 91.73
Total CHOL/HDL Ratio: 3
Triglycerides: 40 mg/dL (ref 0.0–149.0)
VLDL: 8 mg/dL (ref 0.0–40.0)

## 2019-05-20 LAB — COMPREHENSIVE METABOLIC PANEL
ALT: 20 U/L (ref 0–35)
AST: 21 U/L (ref 0–37)
Albumin: 4.4 g/dL (ref 3.5–5.2)
Alkaline Phosphatase: 58 U/L (ref 39–117)
BUN: 14 mg/dL (ref 6–23)
CO2: 27 mEq/L (ref 19–32)
Calcium: 9.5 mg/dL (ref 8.4–10.5)
Chloride: 108 mEq/L (ref 96–112)
Creatinine, Ser: 0.8 mg/dL (ref 0.40–1.20)
GFR: 88.8 mL/min (ref >60.00)
Glucose, Bld: 93 mg/dL (ref 70–99)
Potassium: 4.1 mEq/L (ref 3.5–5.1)
Sodium: 141 mEq/L (ref 135–145)
Total Bilirubin: 0.6 mg/dL (ref 0.2–1.2)
Total Protein: 7.5 g/dL (ref 6.0–8.3)

## 2019-05-27 ENCOUNTER — Ambulatory Visit: Payer: No Typology Code available for payment source | Admitting: Primary Care

## 2019-05-27 ENCOUNTER — Encounter: Payer: Self-pay | Admitting: Primary Care

## 2019-05-27 ENCOUNTER — Other Ambulatory Visit: Payer: Self-pay

## 2019-05-27 VITALS — BP 124/80 | HR 82 | Temp 98.0°F | Ht 62.0 in | Wt 132.0 lb

## 2019-05-27 DIAGNOSIS — K635 Polyp of colon: Secondary | ICD-10-CM | POA: Diagnosis not present

## 2019-05-27 DIAGNOSIS — E785 Hyperlipidemia, unspecified: Secondary | ICD-10-CM | POA: Diagnosis not present

## 2019-05-27 DIAGNOSIS — Z23 Encounter for immunization: Secondary | ICD-10-CM | POA: Diagnosis not present

## 2019-05-27 DIAGNOSIS — Z0000 Encounter for general adult medical examination without abnormal findings: Secondary | ICD-10-CM | POA: Diagnosis not present

## 2019-05-27 DIAGNOSIS — G40909 Epilepsy, unspecified, not intractable, without status epilepticus: Secondary | ICD-10-CM | POA: Diagnosis not present

## 2019-05-27 NOTE — Assessment & Plan Note (Signed)
Has been off of Tegetrol for nearly one year, no seizures. Continue off of Tegretol.

## 2019-05-27 NOTE — Assessment & Plan Note (Signed)
Resolved from recent lipid panel.

## 2019-05-27 NOTE — Progress Notes (Signed)
Subjective:    Patient ID: Hannah Richards, female    DOB: 09/27/1960, 58 y.o.   MRN: [id number]  HPI  Hannah Richards is a 58 year old female who presents today for complete physical.  Immunizations: -Tetanus: Completed in 2017 -Influenza: Due today -Shingles: Never completed  Diet: She endorses a healthy diet. She is eating fruit, vegetables, some protein. Desserts infrequently. Drinking milk, water, unsweet tea, juice on occasion.   Exercise: Yoga, weight training several days weekly  Eye exam: Completes annually  Dental exam: Completes semi-annually Colonoscopy: Completed in 2018, due in 2028 Pap Smear: Completed in 2019 Mammogram: Completed in August 2019, patient would like to defer until 2021 Hep C Screen: Negative in 2016  BP Readings from Last 3 Encounters:  05/27/19 124/80  07/22/18 124/80  03/12/18 102/64     Review of Systems  Constitutional: Negative for unexpected weight change.  HENT: Negative for rhinorrhea.   Respiratory: Negative for cough and shortness of breath.   Cardiovascular: Negative for chest pain.  Gastrointestinal: Negative for constipation and diarrhea.  Genitourinary: Negative for difficulty urinating.  Musculoskeletal: Negative for arthralgias and myalgias.  Skin: Negative for rash.  Allergic/Immunologic: Negative for environmental allergies.  Neurological: Negative for dizziness, numbness and headaches.  Psychiatric/Behavioral: The patient is not nervous/anxious.        Past Medical History:  Diagnosis Date  . Seizure disorder ([place])    Last one in 1994  . Seizures ([place])    last one 20 years ago     Social History   Socioeconomic History  . Marital status: Married    Spouse name: Not on file  . Number of children: Not on file  . Years of education: Not on file  . Highest education level: Not on file  Occupational History  . Not on file  Social Needs  . Financial resource strain: Not on file  . Food insecurity    Worry: Not  on file    Inability: Not on file  . Transportation needs    Medical: Not on file    Non-medical: Not on file  Tobacco Use  . Smoking status: Never Smoker  . Smokeless tobacco: Never Used  Substance and Sexual Activity  . Alcohol use: No    Alcohol/week: 0.0 standard drinks  . Drug use: No  . Sexual activity: Yes    Partners: Male    Birth control/protection: Post-menopausal  Lifestyle  . Physical activity    Days per week: Not on file    Minutes per session: Not on file  . Stress: Not on file  Relationships  . Social Herbalist on phone: Not on file    Gets together: Not on file    Attends religious service: Not on file    Active member of club or organization: Not on file    Attends meetings of clubs or organizations: Not on file    Relationship status: Not on file  . Intimate partner violence    Fear of current or ex partner: Not on file    Emotionally abused: Not on file    Physically abused: Not on file    Forced sexual activity: Not on file  Other Topics Concern  . Not on file  Social History Narrative   Moved from [place] to care for her mother.   Married.   2 daughters.   Highest level of education [employer].   Works doing taxes.    Enjoys exercising, dance, painting.  Past Surgical History:  Procedure Laterality Date  . CESAREAN SECTION    . COLONOSCOPY WITH PROPOFOL N/A 01/22/2017   Procedure: COLONOSCOPY WITH PROPOFOL;  Surgeon: Lucilla Lame, MD;  Location: [facility] ENDOSCOPY;  Service: Endoscopy;  Laterality: N/A;  . CYSTECTOMY Right    Right ear     Family History  Problem Relation Age of Onset  . Hypertension Mother   . Diabetes Maternal Grandmother   . Breast cancer Neg Hx     Allergies  Allergen Reactions  . Peanut-Containing Drug Products Hives  . Penicillins Hives    Current Outpatient Medications on File Prior to Visit  Medication Sig Dispense Refill  . Cholecalciferol (D3 ADULT PO) Take by mouth.    . Flaxseed,  Linseed, (HM FLAXSEED OIL) 1000 MG CAPS Take by mouth.    . Multiple Vitamin (MULTIVITAMIN) tablet Take 1 tablet by mouth daily.    Marland Kitchen pyridoxine (B-6) 100 MG tablet Take 100 mg by mouth daily.     No current facility-administered medications on file prior to visit.     BP 124/80   Pulse 82   Temp 98 F (36.7 C) (Temporal)   Ht 5\' 2"  (1.575 m)   Wt 132 lb (59.9 kg)   LMP 01/22/2014 (Approximate)   SpO2 98%   BMI 24.14 kg/m    Objective:   Physical Exam  Constitutional: She is oriented to person, place, and time. She appears well-nourished.  HENT:  Right Ear: Tympanic membrane and ear canal normal.  Left Ear: Tympanic membrane and ear canal normal.  Mouth/Throat: Oropharynx is clear and moist.  Eyes: Pupils are equal, round, and reactive to light. EOM are normal.  Neck: Neck supple.  Cardiovascular: Normal rate and regular rhythm.  Respiratory: Effort normal and breath sounds normal.  GI: Soft. Bowel sounds are normal. There is no abdominal tenderness.  Musculoskeletal: Normal range of motion.  Neurological: She is alert and oriented to person, place, and time.  Skin: Skin is warm and dry.  Psychiatric: She has a normal mood and affect.           Assessment & Plan:

## 2019-05-27 NOTE — Assessment & Plan Note (Signed)
Shingrix first dose and influenza vaccinations provided.  Pap smear UTD. Colonoscopy UTD, due in 2028 Declines mammogram for this year, would like to defer until 2021. Encouraged regular exercise, healthy diet. Exam today unremarkable. Labs reviewed.

## 2019-05-27 NOTE — Patient Instructions (Signed)
Continue exercising. You should be getting 150 minutes of moderate intensity exercise weekly.  Continue to eat a healthy diet.  Ensure you are consuming 64 ounces of water daily.  Schedule a nurse visit for your second shingles vaccination 2-6 months from now.  It was a pleasure to see you today!   Preventive Care 33-59 Years Old, Female Preventive care refers to visits with your health care provider and lifestyle choices that can promote health and wellness. This includes:  A yearly physical exam. This may also be called an annual well check.  Regular dental visits and eye exams.  Immunizations.  Screening for certain conditions.  Healthy lifestyle choices, such as eating a healthy diet, getting regular exercise, not using drugs or products that contain nicotine and tobacco, and limiting alcohol use. What can I expect for my preventive care visit? Physical exam Your health care provider will check your:  Height and weight. This may be used to calculate body mass index (BMI), which tells if you are at a healthy weight.  Heart rate and blood pressure.  Skin for abnormal spots. Counseling Your health care provider may ask you questions about your:  Alcohol, tobacco, and drug use.  Emotional well-being.  Home and relationship well-being.  Sexual activity.  Eating habits.  Work and work Statistician.  Method of birth control.  Menstrual cycle.  Pregnancy history. What immunizations do I need?  Influenza (flu) vaccine  This is recommended every year. Tetanus, diphtheria, and pertussis (Tdap) vaccine  You may need a Td booster every 10 years. Varicella (chickenpox) vaccine  You may need this if you have not been vaccinated. Zoster (shingles) vaccine  You may need this after age 71. Measles, mumps, and rubella (MMR) vaccine  You may need at least one dose of MMR if you were born in 1957 or later. You may also need a second dose. Pneumococcal conjugate  (PCV13) vaccine  You may need this if you have certain conditions and were not previously vaccinated. Pneumococcal polysaccharide (PPSV23) vaccine  You may need one or two doses if you smoke cigarettes or if you have certain conditions. Meningococcal conjugate (MenACWY) vaccine  You may need this if you have certain conditions. Hepatitis A vaccine  You may need this if you have certain conditions or if you travel or work in places where you may be exposed to hepatitis A. Hepatitis B vaccine  You may need this if you have certain conditions or if you travel or work in places where you may be exposed to hepatitis B. Haemophilus influenzae type b (Hib) vaccine  You may need this if you have certain conditions. Human papillomavirus (HPV) vaccine  If recommended by your health care provider, you may need three doses over 6 months. You may receive vaccines as individual doses or as more than one vaccine together in one shot (combination vaccines). Talk with your health care provider about the risks and benefits of combination vaccines. What tests do I need? Blood tests  Lipid and cholesterol levels. These may be checked every 5 years, or more frequently if you are over 68 years old.  Hepatitis C test.  Hepatitis B test. Screening  Lung cancer screening. You may have this screening every year starting at age 93 if you have a 30-pack-year history of smoking and currently smoke or have quit within the past 15 years.  Colorectal cancer screening. All adults should have this screening starting at age 40 and continuing until age 71. Your health care  provider may recommend screening at age 44 if you are at increased risk. You will have tests every 1-10 years, depending on your results and the type of screening test.  Diabetes screening. This is done by checking your blood sugar (glucose) after you have not eaten for a while (fasting). You may have this done every 1-3 years.  Mammogram. This  may be done every 1-2 years. Talk with your health care provider about when you should start having regular mammograms. This may depend on whether you have a family history of breast cancer.  BRCA-related cancer screening. This may be done if you have a family history of breast, ovarian, tubal, or peritoneal cancers.  Pelvic exam and Pap test. This may be done every 3 years starting at age 39. Starting at age 67, this may be done every 5 years if you have a Pap test in combination with an HPV test. Other tests  Sexually transmitted disease (STD) testing.  Bone density scan. This is done to screen for osteoporosis. You may have this scan if you are at high risk for osteoporosis. Follow these instructions at home: Eating and drinking  Eat a diet that includes fresh fruits and vegetables, whole grains, lean protein, and low-fat dairy.  Take vitamin and mineral supplements as recommended by your health care provider.  Do not drink alcohol if: ? Your health care provider tells you not to drink. ? You are pregnant, may be pregnant, or are planning to become pregnant.  If you drink alcohol: ? Limit how much you have to 0-1 drink a day. ? Be aware of how much alcohol is in your drink. In the [place] one drink equals one 12 oz bottle of beer (355 mL), one 5 oz glass of wine (148 mL), or one 1 oz glass of hard liquor (44 mL). Lifestyle  Take daily care of your teeth and gums.  Stay active. Exercise for at least 30 minutes on 5 or more days each week.  Do not use any products that contain nicotine or tobacco, such as cigarettes, e-cigarettes, and chewing tobacco. If you need help quitting, ask your health care provider.  If you are sexually active, practice safe sex. Use a condom or other form of birth control (contraception) in order to prevent pregnancy and STIs (sexually transmitted infections).  If told by your health care provider, take low-dose aspirin daily starting at age 23. What's  next?  Visit your health care provider once a year for a well check visit.  Ask your health care provider how often you should have your eyes and teeth checked.  Stay up to date on all vaccines. This information is not intended to replace advice given to you by your health care provider. Make sure you discuss any questions you have with your health care provider. Document Released: 09/02/2015 Document Revised: 04/17/2018 Document Reviewed: 04/17/2018 Elsevier Patient Education  2020 [employer].

## 2019-05-27 NOTE — Assessment & Plan Note (Signed)
Colonoscopy completed in 2018, repeat due in 2028.

## 2019-05-28 DIAGNOSIS — Z23 Encounter for immunization: Secondary | ICD-10-CM | POA: Diagnosis not present

## 2019-05-28 NOTE — Addendum Note (Signed)
Addended by: Jacqualin Combes on: 05/28/2019 10:05 AM   Modules accepted: Orders

## 2019-06-05 ENCOUNTER — Encounter: Payer: Self-pay | Admitting: Radiology

## 2019-10-27 ENCOUNTER — Other Ambulatory Visit: Payer: Self-pay

## 2019-10-27 ENCOUNTER — Ambulatory Visit (INDEPENDENT_AMBULATORY_CARE_PROVIDER_SITE_OTHER): Payer: No Typology Code available for payment source | Admitting: *Deleted

## 2019-10-27 DIAGNOSIS — Z23 Encounter for immunization: Secondary | ICD-10-CM

## 2019-10-27 NOTE — Progress Notes (Signed)
Per orders of Allie Bossier, NP, injection of Shingrix given by Virl Cagey. Patient tolerated injection well.

## 2019-11-23 ENCOUNTER — Other Ambulatory Visit: Payer: Self-pay

## 2019-11-23 ENCOUNTER — Ambulatory Visit: Payer: No Typology Code available for payment source | Attending: Internal Medicine

## 2019-11-23 DIAGNOSIS — Z23 Encounter for immunization: Secondary | ICD-10-CM

## 2019-11-23 NOTE — Progress Notes (Signed)
   Covid-19 Vaccination Clinic  Name:  Jakesha Dalzell    MRN: [id number] DOB: [birth year]  11/23/2019  Ms. Mullendore was observed post Covid-19 immunization for 15 minutes without incident. She was provided with Vaccine Information Sheet and instruction to access the V-Safe system.   Ms. Patras was instructed to call 911 with any severe reactions post vaccine: Marland Kitchen Difficulty breathing  . Swelling of face and throat  . A fast heartbeat  . A bad rash all over body  . Dizziness and weakness   Immunizations Administered    Name Date Dose VIS Date Route   Pfizer COVID-19 Vaccine 11/23/2019  3:03 PM 0.3 mL 07/31/2019 Intramuscular   Manufacturer: [place]   Lot: [id number]   [place]: [id number]

## 2019-12-22 ENCOUNTER — Ambulatory Visit: Payer: No Typology Code available for payment source | Attending: Internal Medicine

## 2019-12-22 DIAGNOSIS — Z23 Encounter for immunization: Secondary | ICD-10-CM

## 2019-12-22 NOTE — Progress Notes (Signed)
   Covid-19 Vaccination Clinic  Name:  Hannah Richards    MRN: [id number] DOB: 1961/05/15  12/22/2019  Hannah Richards was observed post Covid-19 immunization for 15 minutes without incident. She was provided with Vaccine Information Sheet and instruction to access the V-Safe system.   Hannah Richards was instructed to call 911 with any severe reactions post vaccine: Marland Kitchen Difficulty breathing  . Swelling of face and throat  . A fast heartbeat  . A bad rash all over body  . Dizziness and weakness   Immunizations Administered    Name Date Dose VIS Date Route   Pfizer COVID-19 Vaccine 12/22/2019  8:51 AM 0.3 mL 10/14/2018 Intramuscular   Manufacturer: [place]   Lot: [id number]   [place]: [id number]

## 2020-06-09 ENCOUNTER — Other Ambulatory Visit: Payer: Self-pay | Admitting: Primary Care

## 2020-07-12 ENCOUNTER — Ambulatory Visit: Payer: No Typology Code available for payment source | Admitting: Obstetrics & Gynecology

## 2020-07-25 ENCOUNTER — Encounter: Payer: Self-pay | Admitting: Obstetrics & Gynecology

## 2020-07-25 ENCOUNTER — Other Ambulatory Visit (HOSPITAL_COMMUNITY)
Admission: RE | Admit: 2020-07-25 | Discharge: 2020-07-25 | Disposition: A | Discharge status: Home / Self Care | Payer: No Typology Code available for payment source | Source: Ambulatory Visit | Attending: Obstetrics & Gynecology | Admitting: Obstetrics & Gynecology

## 2020-07-25 ENCOUNTER — Other Ambulatory Visit: Payer: Self-pay

## 2020-07-25 ENCOUNTER — Ambulatory Visit (INDEPENDENT_AMBULATORY_CARE_PROVIDER_SITE_OTHER): Payer: No Typology Code available for payment source | Admitting: Obstetrics & Gynecology

## 2020-07-25 VITALS — BP 146/83 | HR 71 | Ht 62.0 in | Wt 135.4 lb

## 2020-07-25 DIAGNOSIS — Z01419 Encounter for gynecological examination (general) (routine) without abnormal findings: Secondary | ICD-10-CM | POA: Diagnosis not present

## 2020-07-25 DIAGNOSIS — Z1231 Encounter for screening mammogram for malignant neoplasm of breast: Secondary | ICD-10-CM

## 2020-07-25 NOTE — Progress Notes (Signed)
GYNECOLOGY ANNUAL PREVENTATIVE CARE ENCOUNTER NOTE  History:     Hannah Richards is a 59 y.o. [id number] female here for a routine annual gynecologic exam.  Current complaints: none.   Denies abnormal vaginal bleeding, discharge, pelvic pain, problems with intercourse or other gynecologic concerns.    Gynecologic History Patient's last menstrual period was 01/22/2014 (approximate). Contraception: post menopausal status Last Pap: 08/18/2018. Results were: normal with negative HPV Last mammogram: 04/08/2018. Results were: normal  Obstetric History OB History  Gravida Para Term Preterm AB Living  2 2 2     2   SAB TAB Ectopic Multiple Live Births          2    # Outcome Date GA Lbr Len/2nd Weight Sex Delivery Anes PTL Lv  2 Term      CS-Unspec   LIV  1 Term      Vag-Spont   LIV    Past Medical History:  Diagnosis Date  . Seizure disorder ([place])    Last one in 1994  . Seizures ([place])    last one 20 years ago    Past Surgical History:  Procedure Laterality Date  . CESAREAN SECTION    . COLONOSCOPY WITH PROPOFOL N/A 01/22/2017   Procedure: COLONOSCOPY WITH PROPOFOL;  Surgeon: Lucilla Lame, MD;  Location: [facility] ENDOSCOPY;  Service: Endoscopy;  Laterality: N/A;  . CYSTECTOMY Right    Right ear     Current Outpatient Medications on File Prior to Visit  Medication Sig Dispense Refill  . Cholecalciferol (D3 ADULT PO) Take by mouth.    . Flaxseed, Linseed, (HM FLAXSEED OIL) 1000 MG CAPS Take by mouth.    . Multiple Vitamin (MULTIVITAMIN) tablet Take 1 tablet by mouth daily.    Marland Kitchen pyridoxine (B-6) 100 MG tablet Take 100 mg by mouth daily.     No current facility-administered medications on file prior to visit.    Allergies  Allergen Reactions  . Peanut-Containing Drug Products Hives  . Penicillins Hives    Social History:  reports that she has never smoked. She has never used smokeless tobacco. She reports that she does not drink alcohol and does not use drugs.  Family History    Problem Relation Age of Onset  . Hypertension Mother   . Diabetes Maternal Grandmother   . Breast cancer Neg Hx     The following portions of the patient's history were reviewed and updated as appropriate: allergies, current medications, past family history, past medical history, past social history, past surgical history and problem list.  Review of Systems Pertinent items noted in HPI and remainder of comprehensive ROS otherwise negative.  Physical Exam:  BP (!) 146/83   Pulse 71   Ht 5\' 2"  (1.575 m)   Wt 135 lb 6.4 oz (61.4 kg)   LMP 01/22/2014 (Approximate)   BMI 24.76 kg/m  CONSTITUTIONAL: Well-developed, well-nourished female in no acute distress.  HENT:  Normocephalic, atraumatic, External right and left ear normal.  EYES: Conjunctivae and EOM are normal. Pupils are equal, round, and reactive to light. No scleral icterus.  NECK: Normal range of motion, supple, no masses.  Normal thyroid.  SKIN: Skin is warm and dry. No rash noted. Not diaphoretic. No erythema. No pallor. MUSCULOSKELETAL: Normal range of motion. No tenderness.  No cyanosis, clubbing, or edema.   NEUROLOGIC: Alert and oriented to person, place, and time. Normal reflexes, muscle tone coordination.  PSYCHIATRIC: Normal mood and affect. Normal behavior. Normal judgment and thought content. CARDIOVASCULAR: Normal  heart rate noted, regular rhythm RESPIRATORY: Clear to auscultation bilaterally. Effort and breath sounds normal, no problems with respiration noted. BREASTS: Symmetric in size. No masses, tenderness, skin changes, nipple drainage, or lymphadenopathy bilaterally. Performed in the presence of a chaperone. ABDOMEN: Soft, no distention noted.  No tenderness, rebound or guarding.  PELVIC: Normal appearing external genitalia and urethral meatus with mild atrophy; normal appearing vaginal mucosa and cervix also with mild atrophy noted.  No abnormal discharge noted.  Pap smear obtained.  Normal uterine size, no  other palpable masses, no uterine or adnexal tenderness.  Performed in the presence of a chaperone.   Assessment and Plan:      1. Breast cancer screening by mammogram Mammogram scheduled - MM 3D SCREEN BREAST BILATERAL; Future  2. Well woman exam with routine gynecological exam - Cytology - PAP Will follow up results of pap smear and manage accordingly. Routine preventative health maintenance measures emphasized. Please refer to After Visit Summary for other counseling recommendations.      Verita Schneiders, MD, [place] for [employer], [place]

## 2020-07-25 NOTE — Patient Instructions (Signed)

## 2020-07-27 LAB — CYTOLOGY - PAP
Comment: NEGATIVE
Diagnosis: NEGATIVE
High risk HPV: NEGATIVE

## 2020-08-26 ENCOUNTER — Ambulatory Visit
Admission: RE | Admit: 2020-08-26 | Discharge: 2020-08-26 | Disposition: A | Discharge status: Home / Self Care | Payer: No Typology Code available for payment source | Source: Ambulatory Visit | Attending: Obstetrics & Gynecology | Admitting: Obstetrics & Gynecology

## 2020-08-26 ENCOUNTER — Other Ambulatory Visit: Payer: Self-pay

## 2020-08-26 DIAGNOSIS — Z1231 Encounter for screening mammogram for malignant neoplasm of breast: Secondary | ICD-10-CM | POA: Diagnosis present

## 2021-01-05 ENCOUNTER — Encounter: Payer: No Typology Code available for payment source | Admitting: Primary Care

## 2021-01-10 ENCOUNTER — Ambulatory Visit (INDEPENDENT_AMBULATORY_CARE_PROVIDER_SITE_OTHER): Payer: No Typology Code available for payment source | Admitting: Primary Care

## 2021-01-10 ENCOUNTER — Other Ambulatory Visit: Payer: Self-pay

## 2021-01-10 ENCOUNTER — Encounter: Payer: Self-pay | Admitting: Primary Care

## 2021-01-10 VITALS — BP 134/76 | HR 74 | Temp 97.6°F | Ht 62.0 in | Wt 135.0 lb

## 2021-01-10 DIAGNOSIS — Z0000 Encounter for general adult medical examination without abnormal findings: Secondary | ICD-10-CM

## 2021-01-10 DIAGNOSIS — E785 Hyperlipidemia, unspecified: Secondary | ICD-10-CM

## 2021-01-10 DIAGNOSIS — G40909 Epilepsy, unspecified, not intractable, without status epilepticus: Secondary | ICD-10-CM | POA: Diagnosis not present

## 2021-01-10 LAB — CBC
HCT: 37.7 % (ref 36.0–46.0)
Hemoglobin: 12.3 g/dL (ref 12.0–15.0)
MCHC: 32.6 g/dL (ref 30.0–36.0)
MCV: 85.4 fl (ref 78.0–100.0)
Platelets: 274 10*3/uL (ref 150.0–400.0)
RBC: 4.42 Mil/uL (ref 3.87–5.11)
RDW: 12.9 % (ref 11.5–15.5)
WBC: 4.6 10*3/uL (ref 4.0–10.5)

## 2021-01-10 LAB — LIPID PANEL
Cholesterol: 169 mg/dL (ref 0–200)
HDL: 56.1 mg/dL (ref >39.00)
LDL Cholesterol: 104 mg/dL — ABNORMAL HIGH (ref 0–99)
NonHDL: 113.14
Total CHOL/HDL Ratio: 3
Triglycerides: 46 mg/dL (ref 0.0–149.0)
VLDL: 9.2 mg/dL (ref 0.0–40.0)

## 2021-01-10 LAB — COMPREHENSIVE METABOLIC PANEL
ALT: 22 U/L (ref 0–35)
AST: 23 U/L (ref 0–37)
Albumin: 4.5 g/dL (ref 3.5–5.2)
Alkaline Phosphatase: 58 U/L (ref 39–117)
BUN: 15 mg/dL (ref 6–23)
CO2: 27 mEq/L (ref 19–32)
Calcium: 9.6 mg/dL (ref 8.4–10.5)
Chloride: 104 mEq/L (ref 96–112)
Creatinine, Ser: 0.76 mg/dL (ref 0.40–1.20)
GFR: 84.96 mL/min (ref >60.00)
Glucose, Bld: 84 mg/dL (ref 70–99)
Potassium: 3.9 mEq/L (ref 3.5–5.1)
Sodium: 139 mEq/L (ref 135–145)
Total Bilirubin: 0.7 mg/dL (ref 0.2–1.2)
Total Protein: 7.6 g/dL (ref 6.0–8.3)

## 2021-01-10 NOTE — Progress Notes (Signed)
Subjective:    Patient ID: Hannah Richards, female    DOB: 05/02/1961, 60 y.o.   MRN: [id number]  HPI  Hannah Richards is a very pleasant 60 y.o. female who presents today for complete physical.  Immunizations: -Tetanus: 2017 -Influenza: Completed this season  -Covid-19: Completed 3 vaccines -Shingles: Completed Shingrix   Diet: She endorses a healthy diet.  Exercise: She tries to exercise regularly, 30 min daily.   Eye exam: Completes annually  Dental exam: Completes semi-annully   Pap Smear: Completed in 2019, follows with GYN. Mammogram: January 2022 Colonoscopy: 2018, due 2028  BP Readings from Last 3 Encounters:  01/10/21 134/76  07/25/20 (!) 146/83  05/27/19 124/80       Review of Systems  Constitutional: Negative for unexpected weight change.  HENT: Negative for rhinorrhea.   Eyes: Negative for visual disturbance.  Respiratory: Negative for cough and shortness of breath.   Cardiovascular: Negative for chest pain.  Gastrointestinal: Negative for constipation and diarrhea.  Genitourinary: Negative for difficulty urinating.  Musculoskeletal: Negative for arthralgias.  Skin: Negative for rash.  Allergic/Immunologic: Negative for environmental allergies.  Neurological: Negative for dizziness and headaches.  Psychiatric/Behavioral: The patient is nervous/anxious.          Past Medical History:  Diagnosis Date  . Seizure disorder ([place])    Last one in 1994  . Seizures ([place])    last one 20 years ago    Social History   Socioeconomic History  . Marital status: Married    Spouse name: Not on file  . Number of children: Not on file  . Years of education: Not on file  . Highest education level: Not on file  Occupational History  . Not on file  Tobacco Use  . Smoking status: Never Smoker  . Smokeless tobacco: Never Used  Vaping Use  . Vaping Use: Never used  Substance and Sexual Activity  . Alcohol use: No    Alcohol/week: 0.0 standard drinks  .  Drug use: No  . Sexual activity: Yes    Partners: Male    Birth control/protection: Post-menopausal  Other Topics Concern  . Not on file  Social History Narrative   Moved from [place] to care for her mother.   Married.   2 daughters.   Highest level of education [employer].   Works doing taxes.    Enjoys exercising, dance, painting.   Social Determinants of Health   Financial Resource Strain: Not on file  Food Insecurity: Not on file  Transportation Needs: Not on file  Physical Activity: Not on file  Stress: Not on file  Social Connections: Not on file  Intimate Partner Violence: Not on file    Past Surgical History:  Procedure Laterality Date  . CESAREAN SECTION    . COLONOSCOPY WITH PROPOFOL N/A 01/22/2017   Procedure: COLONOSCOPY WITH PROPOFOL;  Surgeon: Lucilla Lame, MD;  Location: [facility] ENDOSCOPY;  Service: Endoscopy;  Laterality: N/A;  . CYSTECTOMY Right    Right ear     Family History  Problem Relation Age of Onset  . Hypertension Mother   . Diabetes Maternal Grandmother   . Breast cancer Neg Hx     Allergies  Allergen Reactions  . Peanut-Containing Drug Products Hives  . Penicillins Hives    Current Outpatient Medications on File Prior to Visit  Medication Sig Dispense Refill  . Cholecalciferol (D3 ADULT PO) Take by mouth.    . Flaxseed, Linseed, (HM FLAXSEED OIL) 1000 MG CAPS Take by mouth.    Marland Kitchen  Multiple Vitamin (MULTIVITAMIN) tablet Take 1 tablet by mouth daily.    Marland Kitchen pyridoxine (B-6) 100 MG tablet Take 100 mg by mouth daily.    . vitamin B-12 (CYANOCOBALAMIN) 500 MCG tablet Take by mouth.     No current facility-administered medications on file prior to visit.    BP 134/76   Pulse 74   Temp 97.6 F (36.4 C) (Temporal)   Ht 5\' 2"  (1.575 m)   Wt 135 lb (61.2 kg)   LMP 01/22/2014 (Approximate)   SpO2 97%   BMI 24.69 kg/m  Objective:   Physical Exam HENT:     Right Ear: Tympanic membrane and ear canal normal.     Left Ear: Tympanic  membrane and ear canal normal.     Nose: Nose normal.  Eyes:     Conjunctiva/sclera: Conjunctivae normal.     Pupils: Pupils are equal, round, and reactive to light.  Neck:     Thyroid: No thyromegaly.  Cardiovascular:     Rate and Rhythm: Normal rate and regular rhythm.     Heart sounds: No murmur heard.   Pulmonary:     Effort: Pulmonary effort is normal.     Breath sounds: Normal breath sounds. No rales.  Abdominal:     General: Bowel sounds are normal.     Palpations: Abdomen is soft.     Tenderness: There is no abdominal tenderness.  Musculoskeletal:        General: Normal range of motion.     Cervical back: Neck supple.  Lymphadenopathy:     Cervical: No cervical adenopathy.  Skin:    General: Skin is warm and dry.     Findings: No rash.  Neurological:     Mental Status: She is alert and oriented to person, place, and time.     Cranial Nerves: No cranial nerve deficit.     Deep Tendon Reflexes: Reflexes are normal and symmetric.  Psychiatric:        Mood and Affect: Mood normal.           Assessment & Plan:      This visit occurred during the SARS-CoV-2 public health emergency.  Safety protocols were in place, including screening questions prior to the visit, additional usage of staff PPE, and extensive cleaning of exam room while observing appropriate contact time as indicated for disinfecting solutions.

## 2021-01-10 NOTE — Patient Instructions (Addendum)
Stop by the lab prior to leaving today. I will notify you of your results once received.   Continue exercising. You should be getting 150 minutes of moderate intensity exercise weekly.  Continue to work on a healthy diet. Ensure you are consuming 64 ounces of water daily.  It was a pleasure to see you today!   Preventive Care 63-61 Years Old, Female Preventive care refers to lifestyle choices and visits with your health care provider that can promote health and wellness. This includes:  A yearly physical exam. This is also called an annual wellness visit.  Regular dental and eye exams.  Immunizations.  Screening for certain conditions.  Healthy lifestyle choices, such as: ? Eating a healthy diet. ? Getting regular exercise. ? Not using drugs or products that contain nicotine and tobacco. ? Limiting alcohol use. What can I expect for my preventive care visit? Physical exam Your health care provider will check your:  Height and weight. These may be used to calculate your BMI (body mass index). BMI is a measurement that tells if you are at a healthy weight.  Heart rate and blood pressure.  Body temperature.  Skin for abnormal spots. Counseling Your health care provider may ask you questions about your:  Past medical problems.  Family's medical history.  Alcohol, tobacco, and drug use.  Emotional well-being.  Home life and relationship well-being.  Sexual activity.  Diet, exercise, and sleep habits.  Work and work Statistician.  Access to firearms.  Method of birth control.  Menstrual cycle.  Pregnancy history. What immunizations do I need? Vaccines are usually given at various ages, according to a schedule. Your health care provider will recommend vaccines for you based on your age, medical history, and lifestyle or other factors, such as travel or where you work.   What tests do I need? Blood tests  Lipid and cholesterol levels. These may be checked  every 5 years, or more often if you are over 67 years old.  Hepatitis C test.  Hepatitis B test. Screening  Lung cancer screening. You may have this screening every year starting at age 25 if you have a 30-pack-year history of smoking and currently smoke or have quit within the past 15 years.  Colorectal cancer screening. ? All adults should have this screening starting at age 81 and continuing until age 27. ? Your health care provider may recommend screening at age 81 if you are at increased risk. ? You will have tests every 1-10 years, depending on your results and the type of screening test.  Diabetes screening. ? This is done by checking your blood sugar (glucose) after you have not eaten for a while (fasting). ? You may have this done every 1-3 years.  Mammogram. ? This may be done every 1-2 years. ? Talk with your health care provider about when you should start having regular mammograms. This may depend on whether you have a family history of breast cancer.  BRCA-related cancer screening. This may be done if you have a family history of breast, ovarian, tubal, or peritoneal cancers.  Pelvic exam and Pap test. ? This may be done every 3 years starting at age 65. ? Starting at age 27, this may be done every 5 years if you have a Pap test in combination with an HPV test. Other tests  STD (sexually transmitted disease) testing, if you are at risk.  Bone density scan. This is done to screen for osteoporosis. You may have this scan  if you are at high risk for osteoporosis. Talk with your health care provider about your test results, treatment options, and if necessary, the need for more tests. Follow these instructions at home: Eating and drinking  Eat a diet that includes fresh fruits and vegetables, whole grains, lean protein, and low-fat dairy products.  Take vitamin and mineral supplements as recommended by your health care provider.  Do not drink alcohol if: ? Your  health care provider tells you not to drink. ? You are pregnant, may be pregnant, or are planning to become pregnant.  If you drink alcohol: ? Limit how much you have to 0-1 drink a day. ? Be aware of how much alcohol is in your drink. In the [place] one drink equals one 12 oz bottle of beer (355 mL), one 5 oz glass of wine (148 mL), or one 1 oz glass of hard liquor (44 mL).   Lifestyle  Take daily care of your teeth and gums. Brush your teeth every morning and night with fluoride toothpaste. Floss one time each day.  Stay active. Exercise for at least 30 minutes 5 or more days each week.  Do not use any products that contain nicotine or tobacco, such as cigarettes, e-cigarettes, and chewing tobacco. If you need help quitting, ask your health care provider.  Do not use drugs.  If you are sexually active, practice safe sex. Use a condom or other form of protection to prevent STIs (sexually transmitted infections).  If you do not wish to become pregnant, use a form of birth control. If you plan to become pregnant, see your health care provider for a prepregnancy visit.  If told by your health care provider, take low-dose aspirin daily starting at age 51.  Find healthy ways to cope with stress, such as: ? Meditation, yoga, or listening to music. ? Journaling. ? Talking to a trusted person. ? Spending time with friends and family. Safety  Always wear your seat belt while driving or riding in a vehicle.  Do not drive: ? If you have been drinking alcohol. Do not ride with someone who has been drinking. ? When you are tired or distracted. ? While texting.  Wear a helmet and other protective equipment during sports activities.  If you have firearms in your house, make sure you follow all gun safety procedures. What's next?  Visit your health care provider once a year for an annual wellness visit.  Ask your health care provider how often you should have your eyes and teeth  checked.  Stay up to date on all vaccines. This information is not intended to replace advice given to you by your health care provider. Make sure you discuss any questions you have with your health care provider. Document Revised: 05/10/2020 Document Reviewed: 04/17/2018 Elsevier Patient Education  2021 [employer].

## 2021-01-10 NOTE — Assessment & Plan Note (Signed)
Immunizations UTD. Pap smear and mammogram UTD. Colonoscopy UTD due in 2028.  Discussed the importance of a healthy diet and regular exercise in order for weight loss, and to reduce the risk of any potential medical problems.  Exam today unremarkable. Labs pending.

## 2021-01-10 NOTE — Assessment & Plan Note (Signed)
No seizure in years, is no longer on Tegretol. Doing well. Continue to monitor.

## 2021-01-10 NOTE — Assessment & Plan Note (Signed)
Discussed the importance of a healthy diet and regular exercise in order for weight loss, and to reduce the risk of any potential medical problems.  Repeat lipid panel pending.

## 2021-06-29 ENCOUNTER — Encounter: Payer: Self-pay | Admitting: Radiology

## 2021-09-15 ENCOUNTER — Other Ambulatory Visit: Payer: Self-pay | Admitting: Primary Care

## 2021-09-15 DIAGNOSIS — Z1231 Encounter for screening mammogram for malignant neoplasm of breast: Secondary | ICD-10-CM

## 2021-10-25 ENCOUNTER — Other Ambulatory Visit: Payer: Self-pay

## 2021-10-25 ENCOUNTER — Ambulatory Visit
Admission: RE | Admit: 2021-10-25 | Discharge: 2021-10-25 | Disposition: A | Discharge status: Home / Self Care | Payer: No Typology Code available for payment source | Source: Ambulatory Visit | Attending: Primary Care | Admitting: Primary Care

## 2021-10-25 ENCOUNTER — Other Ambulatory Visit: Payer: Self-pay | Admitting: Primary Care

## 2021-10-25 DIAGNOSIS — Z1231 Encounter for screening mammogram for malignant neoplasm of breast: Secondary | ICD-10-CM | POA: Insufficient documentation

## 2021-10-25 DIAGNOSIS — R921 Mammographic calcification found on diagnostic imaging of breast: Secondary | ICD-10-CM

## 2021-10-25 DIAGNOSIS — R928 Other abnormal and inconclusive findings on diagnostic imaging of breast: Secondary | ICD-10-CM

## 2021-11-02 ENCOUNTER — Other Ambulatory Visit: Payer: Self-pay | Admitting: Primary Care

## 2021-11-02 ENCOUNTER — Ambulatory Visit
Admission: RE | Admit: 2021-11-02 | Discharge: 2021-11-02 | Disposition: A | Discharge status: Home / Self Care | Payer: No Typology Code available for payment source | Source: Ambulatory Visit | Attending: Primary Care | Admitting: Primary Care

## 2021-11-02 DIAGNOSIS — R921 Mammographic calcification found on diagnostic imaging of breast: Secondary | ICD-10-CM | POA: Insufficient documentation

## 2021-11-02 DIAGNOSIS — N6322 Unspecified lump in the left breast, upper inner quadrant: Secondary | ICD-10-CM | POA: Insufficient documentation

## 2021-11-02 DIAGNOSIS — R928 Other abnormal and inconclusive findings on diagnostic imaging of breast: Secondary | ICD-10-CM | POA: Insufficient documentation

## 2022-04-13 ENCOUNTER — Ambulatory Visit (INDEPENDENT_AMBULATORY_CARE_PROVIDER_SITE_OTHER): Payer: No Typology Code available for payment source | Admitting: Primary Care

## 2022-04-13 ENCOUNTER — Encounter: Payer: Self-pay | Admitting: Primary Care

## 2022-04-13 VITALS — BP 130/76 | HR 63 | Temp 98.6°F | Resp 16 | Ht 62.0 in | Wt 131.0 lb

## 2022-04-13 DIAGNOSIS — G40909 Epilepsy, unspecified, not intractable, without status epilepticus: Secondary | ICD-10-CM

## 2022-04-13 DIAGNOSIS — Z0000 Encounter for general adult medical examination without abnormal findings: Secondary | ICD-10-CM

## 2022-04-13 DIAGNOSIS — E785 Hyperlipidemia, unspecified: Secondary | ICD-10-CM

## 2022-04-13 NOTE — Assessment & Plan Note (Signed)
Immunizations UTD. She will obtain flu shot later next month. Pap smear UTD. Mammogram UTD Colonoscopy UTD, due in 2028.   Discussed the importance of a healthy diet and regular exercise in order for weight loss, and to reduce the risk of further co-morbidity.  Exam stable. Labs pending.  Follow up in 1 year for repeat physical.

## 2022-04-13 NOTE — Assessment & Plan Note (Signed)
No seizures in years. Remain off Tegretol.  Continue to monitor.

## 2022-04-13 NOTE — Patient Instructions (Signed)
Stop by the lab prior to leaving today. I will notify you of your results once received.   It was a pleasure to see you today!  Preventive Care 78-62 Years Old, Female Preventive care refers to lifestyle choices and visits with your health care provider that can promote health and wellness. Preventive care visits are also called wellness exams. What can I expect for my preventive care visit? Counseling Your health care provider may ask you questions about your: Medical history, including: Past medical problems. Family medical history. Pregnancy history. Current health, including: Menstrual cycle. Method of birth control. Emotional well-being. Home life and relationship well-being. Sexual activity and sexual health. Lifestyle, including: Alcohol, nicotine or tobacco, and drug use. Access to firearms. Diet, exercise, and sleep habits. Work and work Statistician. Sunscreen use. Safety issues such as seatbelt and bike helmet use. Physical exam Your health care provider will check your: Height and weight. These may be used to calculate your BMI (body mass index). BMI is a measurement that tells if you are at a healthy weight. Waist circumference. This measures the distance around your waistline. This measurement also tells if you are at a healthy weight and may help predict your risk of certain diseases, such as type 2 diabetes and high blood pressure. Heart rate and blood pressure. Body temperature. Skin for abnormal spots. What immunizations do I need?  Vaccines are usually given at various ages, according to a schedule. Your health care provider will recommend vaccines for you based on your age, medical history, and lifestyle or other factors, such as travel or where you work. What tests do I need? Screening Your health care provider may recommend screening tests for certain conditions. This may include: Lipid and cholesterol levels. Diabetes screening. This is done by checking  your blood sugar (glucose) after you have not eaten for a while (fasting). Pelvic exam and Pap test. Hepatitis B test. Hepatitis C test. HIV (human immunodeficiency virus) test. STI (sexually transmitted infection) testing, if you are at risk. Lung cancer screening. Colorectal cancer screening. Mammogram. Talk with your health care provider about when you should start having regular mammograms. This may depend on whether you have a family history of breast cancer. BRCA-related cancer screening. This may be done if you have a family history of breast, ovarian, tubal, or peritoneal cancers. Bone density scan. This is done to screen for osteoporosis. Talk with your health care provider about your test results, treatment options, and if necessary, the need for more tests. Follow these instructions at home: Eating and drinking  Eat a diet that includes fresh fruits and vegetables, whole grains, lean protein, and low-fat dairy products. Take vitamin and mineral supplements as recommended by your health care provider. Do not drink alcohol if: Your health care provider tells you not to drink. You are pregnant, may be pregnant, or are planning to become pregnant. If you drink alcohol: Limit how much you have to 0-1 drink a day. Know how much alcohol is in your drink. In the [place] one drink equals one 12 oz bottle of beer (355 mL), one 5 oz glass of wine (148 mL), or one 1 oz glass of hard liquor (44 mL). Lifestyle Brush your teeth every morning and night with fluoride toothpaste. Floss one time each day. Exercise for at least 30 minutes 5 or more days each week. Do not use any products that contain nicotine or tobacco. These products include cigarettes, chewing tobacco, and vaping devices, such as e-cigarettes. If you need  help quitting, ask your health care provider. Do not use drugs. If you are sexually active, practice safe sex. Use a condom or other form of protection to prevent STIs. If you  do not wish to become pregnant, use a form of birth control. If you plan to become pregnant, see your health care provider for a prepregnancy visit. Take aspirin only as told by your health care provider. Make sure that you understand how much to take and what form to take. Work with your health care provider to find out whether it is safe and beneficial for you to take aspirin daily. Find healthy ways to manage stress, such as: Meditation, yoga, or listening to music. Journaling. Talking to a trusted person. Spending time with friends and family. Minimize exposure to UV radiation to reduce your risk of skin cancer. Safety Always wear your seat belt while driving or riding in a vehicle. Do not drive: If you have been drinking alcohol. Do not ride with someone who has been drinking. When you are tired or distracted. While texting. If you have been using any mind-altering substances or drugs. Wear a helmet and other protective equipment during sports activities. If you have firearms in your house, make sure you follow all gun safety procedures. Seek help if you have been physically or sexually abused. What's next? Visit your health care provider once a year for an annual wellness visit. Ask your health care provider how often you should have your eyes and teeth checked. Stay up to date on all vaccines. This information is not intended to replace advice given to you by your health care provider. Make sure you discuss any questions you have with your health care provider. Document Revised: 02/01/2021 Document Reviewed: 02/01/2021 Elsevier Patient Education  [place].

## 2022-04-13 NOTE — Assessment & Plan Note (Signed)
Repeat lipid panel pending.  Not on treatment. Overall she controls with diet. Continue to monitor.

## 2022-04-13 NOTE — Progress Notes (Signed)
Subjective:    Patient ID: Hannah Richards, female    DOB: May 28, 1961, 61 y.o.   MRN: [id number]  HPI  Hannah Richards is a very pleasant 61 y.o. female who presents today for complete physical and follow up of chronic conditions.  Immunizations: -Tetanus: 2017 -Influenza: Due this season, will get later this month -Covid-19: 4 vaccines -Shingles: Completed Shingrix  Diet: Fair diet.  Exercise: Regular exercise, twice weekly at the gym, walking on other days.  Eye exam: Completes annually  Dental exam: Completes semi-annually   Pap Smear: Completed in 2021 Mammogram: Completed in March 2023  Colonoscopy: Completed in 2018, due 2028  BP Readings from Last 3 Encounters:  04/13/22 130/76  01/10/21 134/76  07/25/20 (!) 146/83       Review of Systems  Constitutional:  Negative for unexpected weight change.  HENT:  Negative for rhinorrhea.   Respiratory:  Negative for cough and shortness of breath.   Cardiovascular:  Negative for chest pain.  Gastrointestinal:  Negative for constipation and diarrhea.  Genitourinary:  Negative for difficulty urinating.  Musculoskeletal:  Positive for arthralgias.  Skin:  Negative for rash.  Allergic/Immunologic: Negative for environmental allergies.  Neurological:  Negative for dizziness, seizures and headaches.  Psychiatric/Behavioral:  The patient is not nervous/anxious.          Past Medical History:  Diagnosis Date   Seizure disorder ([place])    Last one in 1994   Seizures ([employer])    last one 20 years ago    Social History   Socioeconomic History   Marital status: Married    Spouse name: Not on file   Number of children: Not on file   Years of education: Not on file   Highest education level: Not on file  Occupational History   Not on file  Tobacco Use   Smoking status: Never   Smokeless tobacco: Never  Vaping Use   Vaping Use: Never used  Substance and Sexual Activity   Alcohol use: No    Alcohol/week: 0.0 standard  drinks of alcohol   Drug use: No   Sexual activity: Yes    Partners: Male    Birth control/protection: Post-menopausal  Other Topics Concern   Not on file  Social History Narrative   Moved from [place] to care for her mother.   Married.   2 daughters.   Highest level of education [employer].   Works doing taxes.    Enjoys exercising, dance, painting.   Social Determinants of Health   Financial Resource Strain: Not on file  Food Insecurity: Not on file  Transportation Needs: Not on file  Physical Activity: Not on file  Stress: Not on file  Social Connections: Not on file  Intimate Partner Violence: Not on file    Past Surgical History:  Procedure Laterality Date   CESAREAN SECTION     COLONOSCOPY WITH PROPOFOL N/A 01/22/2017   Procedure: COLONOSCOPY WITH PROPOFOL;  Surgeon: Lucilla Lame, MD;  Location: [facility] ENDOSCOPY;  Service: Endoscopy;  Laterality: N/A;   CYSTECTOMY Right    Right ear     Family History  Problem Relation Age of Onset   Hypertension Mother    Diabetes Maternal Grandmother    Breast cancer Neg Hx     Allergies  Allergen Reactions   Peanut-Containing Drug Products Hives   Penicillins Hives    Current Outpatient Medications on File Prior to Visit  Medication Sig Dispense Refill   Cholecalciferol (D3 ADULT PO) Take by mouth.  Flaxseed, Linseed, (HM FLAXSEED OIL) 1000 MG CAPS Take by mouth.     Multiple Vitamin (MULTIVITAMIN) tablet Take 1 tablet by mouth daily.     pyridoxine (B-6) 100 MG tablet Take 100 mg by mouth daily.     vitamin B-12 (CYANOCOBALAMIN) 500 MCG tablet Take by mouth.     No current facility-administered medications on file prior to visit.    BP 130/76   Pulse 63   Temp 98.6 F (37 C)   Resp 16   Ht '5\' 2"'$  (1.575 m)   Wt 131 lb (59.4 kg)   LMP 01/22/2014 (Approximate)   SpO2 99%   BMI 23.96 kg/m  Objective:   Physical Exam HENT:     Right Ear: Tympanic membrane and ear canal normal.     Left Ear: Tympanic  membrane and ear canal normal.     Nose: Nose normal.  Eyes:     Conjunctiva/sclera: Conjunctivae normal.     Pupils: Pupils are equal, round, and reactive to light.  Neck:     Thyroid: No thyromegaly.  Cardiovascular:     Rate and Rhythm: Normal rate and regular rhythm.     Heart sounds: No murmur heard. Pulmonary:     Effort: Pulmonary effort is normal.     Breath sounds: Normal breath sounds. No rales.  Abdominal:     General: Bowel sounds are normal.     Palpations: Abdomen is soft.     Tenderness: There is no abdominal tenderness.  Musculoskeletal:        General: Normal range of motion.     Cervical back: Neck supple.  Lymphadenopathy:     Cervical: No cervical adenopathy.  Skin:    General: Skin is warm and dry.     Findings: No rash.  Neurological:     Mental Status: She is alert and oriented to person, place, and time.     Cranial Nerves: No cranial nerve deficit.     Deep Tendon Reflexes: Reflexes are normal and symmetric.  Psychiatric:        Mood and Affect: Mood normal.           Assessment & Plan:   Problem List Items Addressed This Visit       Nervous and Auditory   Seizure disorder ([place])    No seizures in years. Remain off Tegretol.  Continue to monitor.         Other   Hyperlipidemia    Repeat lipid panel pending.  Not on treatment. Overall she controls with diet. Continue to monitor.       Relevant Orders   Lipid panel   Comprehensive metabolic panel   Preventative health care - Primary    Immunizations UTD. She will obtain flu shot later next month. Pap smear UTD. Mammogram UTD Colonoscopy UTD, due in 2028.   Discussed the importance of a healthy diet and regular exercise in order for weight loss, and to reduce the risk of further co-morbidity.  Exam stable. Labs pending.  Follow up in 1 year for repeat physical.           Pleas Koch, NP

## 2022-04-14 LAB — COMPREHENSIVE METABOLIC PANEL
ALT: 18 IU/L (ref 0–32)
AST: 20 IU/L (ref 0–40)
Albumin/Globulin Ratio: 1.6 (ref 1.2–2.2)
Albumin: 4.6 g/dL (ref 3.9–4.9)
Alkaline Phosphatase: 64 IU/L (ref 44–121)
BUN/Creatinine Ratio: 12 (ref 12–28)
BUN: 10 mg/dL (ref 8–27)
Bilirubin Total: 0.8 mg/dL (ref 0.0–1.2)
CO2: 22 mmol/L (ref 20–29)
Calcium: 9.7 mg/dL (ref 8.7–10.3)
Chloride: 102 mmol/L (ref 96–106)
Creatinine, Ser: 0.81 mg/dL (ref 0.57–1.00)
Globulin, Total: 2.8 g/dL (ref 1.5–4.5)
Glucose: 88 mg/dL (ref 70–99)
Potassium: 4.4 mmol/L (ref 3.5–5.2)
Sodium: 141 mmol/L (ref 134–144)
Total Protein: 7.4 g/dL (ref 6.0–8.5)
eGFR: 82 mL/min/{1.73_m2} (ref >59)

## 2022-04-14 LAB — LIPID PANEL
Chol/HDL Ratio: 3 ratio (ref 0.0–4.4)
Cholesterol, Total: 182 mg/dL (ref 100–199)
HDL: 60 mg/dL (ref >39)
LDL Chol Calc (NIH): 112 mg/dL — ABNORMAL HIGH (ref 0–99)
Triglycerides: 52 mg/dL (ref 0–149)
VLDL Cholesterol Cal: 10 mg/dL (ref 5–40)

## 2022-09-25 ENCOUNTER — Other Ambulatory Visit: Payer: Self-pay | Admitting: Primary Care

## 2022-09-25 DIAGNOSIS — Z1231 Encounter for screening mammogram for malignant neoplasm of breast: Secondary | ICD-10-CM

## 2022-11-05 ENCOUNTER — Ambulatory Visit
Admission: RE | Admit: 2022-11-05 | Discharge: 2022-11-05 | Disposition: A | Discharge status: Home / Self Care | Payer: No Typology Code available for payment source | Source: Ambulatory Visit | Attending: Primary Care | Admitting: Primary Care

## 2022-11-05 DIAGNOSIS — Z1231 Encounter for screening mammogram for malignant neoplasm of breast: Secondary | ICD-10-CM | POA: Diagnosis present

## 2022-11-07 ENCOUNTER — Other Ambulatory Visit: Payer: Self-pay | Admitting: Primary Care

## 2022-11-07 DIAGNOSIS — N6489 Other specified disorders of breast: Secondary | ICD-10-CM

## 2022-11-07 DIAGNOSIS — R928 Other abnormal and inconclusive findings on diagnostic imaging of breast: Secondary | ICD-10-CM

## 2022-11-29 ENCOUNTER — Ambulatory Visit
Admission: RE | Admit: 2022-11-29 | Discharge: 2022-11-29 | Disposition: A | Discharge status: Home / Self Care | Payer: No Typology Code available for payment source | Source: Ambulatory Visit | Attending: Primary Care | Admitting: Primary Care

## 2022-11-29 DIAGNOSIS — R928 Other abnormal and inconclusive findings on diagnostic imaging of breast: Secondary | ICD-10-CM

## 2022-11-29 DIAGNOSIS — N6489 Other specified disorders of breast: Secondary | ICD-10-CM | POA: Diagnosis present

## 2023-05-16 NOTE — Telephone Encounter (Signed)
See other my chart message encounter.

## 2023-06-26 ENCOUNTER — Encounter: Payer: No Typology Code available for payment source | Admitting: Primary Care

## 2023-07-01 ENCOUNTER — Ambulatory Visit (INDEPENDENT_AMBULATORY_CARE_PROVIDER_SITE_OTHER): Payer: No Typology Code available for payment source | Admitting: Primary Care

## 2023-07-01 VITALS — BP 128/70 | HR 64 | Temp 97.3°F | Ht 62.0 in | Wt 134.0 lb

## 2023-07-01 DIAGNOSIS — Z0000 Encounter for general adult medical examination without abnormal findings: Secondary | ICD-10-CM | POA: Diagnosis not present

## 2023-07-01 DIAGNOSIS — G40909 Epilepsy, unspecified, not intractable, without status epilepticus: Secondary | ICD-10-CM | POA: Diagnosis not present

## 2023-07-01 DIAGNOSIS — E785 Hyperlipidemia, unspecified: Secondary | ICD-10-CM

## 2023-07-01 NOTE — Patient Instructions (Signed)
Stop by the lab prior to leaving today. I will notify you of your results once received.   It was a pleasure to see you today!  

## 2023-07-01 NOTE — Assessment & Plan Note (Signed)
Immunizations UTD. Pap smear UTD, due in 2025. Mammogram UTD Colonoscopy UTD, due 2028  Discussed the importance of a healthy diet and regular exercise in order for weight loss, and to reduce the risk of further co-morbidity.  Exam stable. Labs pending.  Follow up in 1 year for repeat physical.

## 2023-07-01 NOTE — Addendum Note (Signed)
Addended by: Alvina Chou on: 07/01/2023 09:06 AM   Modules accepted: Orders

## 2023-07-01 NOTE — Assessment & Plan Note (Signed)
No seizure activity in years. Continue to monitor.   Remain off treatment.

## 2023-07-01 NOTE — Progress Notes (Signed)
Subjective:    Patient ID: Hannah Richards, female    DOB: Jan 08, 1961, 62 y.o.   MRN: [id number]  HPI  Brelyn Bonito is a very pleasant 62 y.o. female who presents today for complete physical and follow up of chronic conditions.  Immunizations: -Tetanus: Completed in 2017 -Influenza: Completed this season  -Shingles: Completed Shingrix series  Diet: Fair diet.  Exercise: Exercising several times weekly   Eye exam: Completes annually  Dental exam: Completes semi-annually    Pap Smear: Completed in December 2021 Mammogram: Completed in April 2024  Colonoscopy: Completed in 2018, due 2028  BP Readings from Last 3 Encounters:  07/01/23 128/70  04/13/22 130/76  01/10/21 134/76       Review of Systems  Constitutional:  Negative for unexpected weight change.  HENT:  Negative for rhinorrhea.   Eyes:  Positive for visual disturbance.  Respiratory:  Negative for cough and shortness of breath.   Cardiovascular:  Negative for chest pain.  Gastrointestinal:  Negative for constipation and diarrhea.  Genitourinary:  Negative for difficulty urinating.  Musculoskeletal:  Negative for arthralgias and myalgias.  Skin:  Negative for rash.  Allergic/Immunologic: Negative for environmental allergies.  Neurological:  Negative for dizziness and headaches.  Psychiatric/Behavioral:  The patient is not nervous/anxious.          Past Medical History:  Diagnosis Date   Seizure disorder ([facility])    Last one in 1994   Seizures ([facility])    last one 20 years ago    Social History   Socioeconomic History   Marital status: Married    Spouse name: Not on file   Number of children: Not on file   Years of education: Not on file   Highest education level: Master's degree (e.g., MA, MS, MEng, MEd, MSW, MBA)  Occupational History   Not on file  Tobacco Use   Smoking status: Never   Smokeless tobacco: Never  Vaping Use   Vaping status: Never Used  Substance and Sexual Activity   Alcohol  use: No    Alcohol/week: 0.0 standard drinks of alcohol   Drug use: No   Sexual activity: Yes    Partners: Male    Birth control/protection: Post-menopausal  Other Topics Concern   Not on file  Social History Narrative   Moved from [place] to care for her mother.   Married.   2 daughters.   Highest level of education [employer].   Works doing taxes.    Enjoys exercising, dance, painting.   Social Determinants of Health   Financial Resource Strain: Low Risk  (07/01/2023)   Overall Financial Resource Strain (CARDIA)    Difficulty of Paying Living Expenses: Not very hard  Food Insecurity: No Food Insecurity (07/01/2023)   Hunger Vital Sign    Worried About Running Out of Food in the Last Year: Never true    Ran Out of Food in the Last Year: Never true  Transportation Needs: No Transportation Needs (07/01/2023)   PRAPARE - Administrator, Civil Service (Medical): No    Lack of Transportation (Non-Medical): No  Physical Activity: Sufficiently Active (07/01/2023)   Exercise Vital Sign    Days of Exercise per Week: 4 days    Minutes of Exercise per Session: 150+ min  Stress: Stress Concern Present (07/01/2023)   [employer] of Occupational Health - Occupational Stress Questionnaire    Feeling of Stress : To some extent  Social Connections: Socially Integrated (07/01/2023)   Social Connection and  Isolation Panel [NHANES]    Frequency of Communication with Friends and Family: More than three times a week    Frequency of Social Gatherings with Friends and Family: Three times a week    Attends Religious Services: More than 4 times per year    Active Member of Clubs or Organizations: Yes    Attends Banker Meetings: More than 4 times per year    Marital Status: Married  Catering manager Violence: Not on file    Past Surgical History:  Procedure Laterality Date   CESAREAN SECTION     COLONOSCOPY WITH PROPOFOL N/A 01/22/2017   Procedure:  COLONOSCOPY WITH PROPOFOL;  Surgeon: Midge Minium, MD;  Location: [facility] ENDOSCOPY;  Service: Endoscopy;  Laterality: N/A;   CYSTECTOMY Right    Right ear     Family History  Problem Relation Age of Onset   Hypertension Mother    Stroke Mother    Dementia Mother    Diabetes Maternal Grandmother    Breast cancer Neg Hx     Allergies  Allergen Reactions   Peanut-Containing Drug Products Hives   Penicillins Hives    Current Outpatient Medications on File Prior to Visit  Medication Sig Dispense Refill   Calcium-Magnesium-Vitamin D (CALCIUM 1200+D3 PO) Take 600 mg by mouth daily.     Cholecalciferol (D3 ADULT PO) Take by mouth.     Flaxseed, Linseed, (HM FLAXSEED OIL) 1000 MG CAPS Take by mouth.     Multiple Vitamin (MULTIVITAMIN) tablet Take 1 tablet by mouth daily.     pyridoxine (B-6) 100 MG tablet Take 100 mg by mouth daily.     vitamin B-12 (CYANOCOBALAMIN) 500 MCG tablet Take by mouth.     No current facility-administered medications on file prior to visit.    BP 128/70   Pulse 64   Temp (!) 97.3 F (36.3 C) (Temporal)   Ht 5\' 2"  (1.575 m)   Wt 134 lb (60.8 kg)   LMP 01/22/2014 (Approximate)   SpO2 97%   BMI 24.51 kg/m  Objective:   Physical Exam HENT:     Right Ear: Tympanic membrane and ear canal normal.     Left Ear: Tympanic membrane and ear canal normal.  Eyes:     Pupils: Pupils are equal, round, and reactive to light.  Cardiovascular:     Rate and Rhythm: Normal rate and regular rhythm.  Pulmonary:     Effort: Pulmonary effort is normal.     Breath sounds: Normal breath sounds.  Abdominal:     General: Bowel sounds are normal.     Palpations: Abdomen is soft.     Tenderness: There is no abdominal tenderness.  Musculoskeletal:        General: Normal range of motion.     Cervical back: Neck supple.  Skin:    General: Skin is warm and dry.  Neurological:     Mental Status: She is alert and oriented to person, place, and time.     Cranial Nerves: No  cranial nerve deficit.     Deep Tendon Reflexes:     Reflex Scores:      Patellar reflexes are 2+ on the right side and 2+ on the left side. Psychiatric:        Mood and Affect: Mood normal.           Assessment & Plan:  Preventative health care Assessment & Plan: Immunizations UTD. Pap smear UTD, due in 2025. Mammogram UTD Colonoscopy UTD, due 2028  Discussed the importance of a healthy diet and regular exercise in order for weight loss, and to reduce the risk of further co-morbidity.  Exam stable. Labs pending.  Follow up in 1 year for repeat physical.    Seizure disorder [facility]) Assessment & Plan: No seizure activity in years. Continue to monitor.   Remain off treatment.    Hyperlipidemia, unspecified hyperlipidemia type Assessment & Plan: Repeat lipid panel pending.  Continue to monitor.   Orders: -     CBC -     Comprehensive metabolic panel -     Lipid panel -     Hemoglobin A1c        Doreene Nest, NP

## 2023-07-01 NOTE — Assessment & Plan Note (Signed)
Repeat lipid panel pending.  Continue to monitor.

## 2023-07-02 LAB — COMPREHENSIVE METABOLIC PANEL
AG Ratio: 1.5 (calc) (ref 1.0–2.5)
ALT: 17 U/L (ref 6–29)
AST: 19 U/L (ref 10–35)
Albumin: 4.3 g/dL (ref 3.6–5.1)
Alkaline phosphatase (APISO): 67 U/L (ref 37–153)
BUN: 9 mg/dL (ref 7–25)
CO2: 27 mmol/L (ref 20–32)
Calcium: 9.3 mg/dL (ref 8.6–10.4)
Chloride: 105 mmol/L (ref 98–110)
Creat: 0.69 mg/dL (ref 0.50–1.05)
Globulin: 2.8 g/dL (ref 1.9–3.7)
Glucose, Bld: 77 mg/dL (ref 65–99)
Potassium: 4.1 mmol/L (ref 3.5–5.3)
Sodium: 139 mmol/L (ref 135–146)
Total Bilirubin: 0.7 mg/dL (ref 0.2–1.2)
Total Protein: 7.1 g/dL (ref 6.1–8.1)

## 2023-07-02 LAB — CBC
HCT: 38.3 % (ref 35.0–45.0)
Hemoglobin: 12.4 g/dL (ref 11.7–15.5)
MCH: 27.8 pg (ref 27.0–33.0)
MCHC: 32.4 g/dL (ref 32.0–36.0)
MCV: 85.9 fL (ref 80.0–100.0)
MPV: 11 fL (ref 7.5–12.5)
Platelets: 316 10*3/uL (ref 140–400)
RBC: 4.46 10*6/uL (ref 3.80–5.10)
RDW: 12.4 % (ref 11.0–15.0)
WBC: 4.7 10*3/uL (ref 3.8–10.8)

## 2023-07-02 LAB — HEMOGLOBIN A1C
Hgb A1c MFr Bld: 5.5 %{Hb} (ref <5.7)
Mean Plasma Glucose: 111 mg/dL
eAG (mmol/L): 6.2 mmol/L

## 2023-07-02 LAB — LIPID PANEL
Cholesterol: 169 mg/dL (ref <200)
HDL: 57 mg/dL (ref >=50)
LDL Cholesterol (Calc): 98 mg/dL
Non-HDL Cholesterol (Calc): 112 mg/dL (ref <130)
Total CHOL/HDL Ratio: 3 (calc) (ref <5.0)
Triglycerides: 57 mg/dL (ref <150)

## 2024-01-08 ENCOUNTER — Ambulatory Visit: Payer: Self-pay | Admitting: Primary Care

## 2024-01-08 ENCOUNTER — Encounter: Payer: Self-pay | Admitting: Primary Care

## 2024-01-08 VITALS — BP 138/76 | HR 70 | Temp 97.3°F | Ht 62.0 in | Wt 139.0 lb

## 2024-01-08 DIAGNOSIS — R350 Frequency of micturition: Secondary | ICD-10-CM | POA: Diagnosis not present

## 2024-01-08 LAB — POC URINALSYSI DIPSTICK (AUTOMATED)
Bilirubin, UA: NEGATIVE
Blood, UA: POSITIVE
Glucose, UA: NEGATIVE
Ketones, UA: NEGATIVE
Leukocytes, UA: NEGATIVE
Nitrite, UA: NEGATIVE
Protein, UA: POSITIVE — AB
Spec Grav, UA: 1.015 (ref 1.010–1.025)
Urobilinogen, UA: 0.2 U/dL
pH, UA: 6 (ref 5.0–8.0)

## 2024-01-08 NOTE — Addendum Note (Signed)
 Addended by: Gerry Krone on: 01/08/2024 10:02 AM   Modules accepted: Orders

## 2024-01-08 NOTE — Assessment & Plan Note (Signed)
 Fortunately, symptoms have resolved.  Urinalysis today with 2+ blood, otherwise unremarkable. Urine culture and urine microscopic ordered and pending.  Await results.

## 2024-01-08 NOTE — Progress Notes (Signed)
 Subjective:    Patient ID: Hannah Richards, female    DOB: 10-01-1960, 63 y.o.   MRN: [id number]  Back Pain Pertinent negatives include no abdominal pain, dysuria, fever or pelvic pain.    Hannah Richards is a very pleasant 63 y.o. female with a history of hyperlipidemia, seizure disorder who presents today to discuss urinary frequency.  Symptom onset two weeks ago with bilateral lower back pain. She then noticed urinary frequency, vaginal throbbing with urination. She went to [facility], took Monistat for 3 days and symptoms resolved. Symptoms resolved 1 week ago.   She denies vaginal itching, vaginal discharge, vaginal bleeding, flank pain, fevers, abdominal pain. She admits to little water intake during the day.     Review of Systems  Constitutional:  Negative for fever.  Gastrointestinal:  Negative for abdominal pain.  Genitourinary:  Negative for difficulty urinating, dysuria, frequency, pelvic pain, urgency, vaginal bleeding and vaginal discharge.  Musculoskeletal:  Positive for back pain.         Past Medical History:  Diagnosis Date   Seizure disorder ([facility])    Last one in 1994   Seizures ([facility])    last one 20 years ago    Social History   Socioeconomic History   Marital status: Married    Spouse name: Not on file   Number of children: Not on file   Years of education: Not on file   Highest education level: Master's degree (e.g., MA, MS, MEng, MEd, MSW, MBA)  Occupational History   Not on file  Tobacco Use   Smoking status: Never   Smokeless tobacco: Never  Vaping Use   Vaping status: Never Used  Substance and Sexual Activity   Alcohol use: No    Alcohol/week: 0.0 standard drinks of alcohol   Drug use: No   Sexual activity: Yes    Partners: Male    Birth control/protection: Post-menopausal  Other Topics Concern   Not on file  Social History Narrative   Moved from [place]  to care for her mother.   Married.   2 daughters.   Highest level of education  [employer].   Works doing taxes.    Enjoys exercising, dance, painting.   Social Drivers of Corporate investment banker Strain: Low Risk  (07/01/2023)   Overall Financial Resource Strain (CARDIA)    Difficulty of Paying Living Expenses: Not very hard  Food Insecurity: No Food Insecurity (07/01/2023)   Hunger Vital Sign    Worried About Running Out of Food in the Last Year: Never true    Ran Out of Food in the Last Year: Never true  Transportation Needs: No Transportation Needs (07/01/2023)   PRAPARE - Administrator, Civil Service (Medical): No    Lack of Transportation (Non-Medical): No  Physical Activity: Sufficiently Active (07/01/2023)   Exercise Vital Sign    Days of Exercise per Week: 4 days    Minutes of Exercise per Session: 150+ min  Stress: Stress Concern Present (07/01/2023)   [employer] of Occupational Health - Occupational Stress Questionnaire    Feeling of Stress : To some extent  Social Connections: Socially Integrated (07/01/2023)   Social Connection and Isolation Panel [NHANES]    Frequency of Communication with Friends and Family: More than three times a week    Frequency of Social Gatherings with Friends and Family: Three times a week    Attends Religious Services: More than 4 times per year    Active Member of Clubs  or Organizations: Yes    Attends Banker Meetings: More than 4 times per year    Marital Status: Married  Catering manager Violence: Not on file    Past Surgical History:  Procedure Laterality Date   CESAREAN SECTION     COLONOSCOPY WITH PROPOFOL  N/A 01/22/2017   Procedure: COLONOSCOPY WITH PROPOFOL ;  Surgeon: Marnee Sink, MD;  Location: [facility] END[facility]COPY;  Service: Endoscopy;  Laterality: N/A;   CYSTECTOMY Right    Right ear     Family History  Problem Relation Age of Onset   Hypertension Mother    Stroke Mother    Dementia Mother    Diabetes Maternal Grandmother    Breast cancer Neg Hx     Allergies   Allergen Reactions   Peanut-Containing Drug Products Hives   Penicillins Hives    Current Outpatient Medications on File Prior to Visit  Medication Sig Dispense Refill   Calcium-Magnesium-Vitamin D  (CALCIUM 1200+D3 PO) Take 600 mg by mouth daily.     Cholecalciferol (D3 ADULT PO) Take by mouth.     Multiple Vitamin (MULTIVITAMIN) tablet Take 1 tablet by mouth daily.     pyridoxine (B-6) 100 MG tablet Take 100 mg by mouth daily.     Flaxseed, Linseed, (HM FLAXSEED OIL) 1000 MG CAPS Take by mouth. (Patient not taking: Reported on 01/08/2024)     No current facility-administered medications on file prior to visit.    BP 138/76   Pulse 70   Temp (!) 97.3 F (36.3 C) (Temporal)   Ht 5\' 2"  (1.575 m)   Wt 139 lb (63 kg)   LMP 01/22/2014 (Approximate)   SpO2 99%   BMI 25.42 kg/m  Objective:   Physical Exam Cardiovascular:     Rate and Rhythm: Normal rate and regular rhythm.  Pulmonary:     Effort: Pulmonary effort is normal.     Breath sounds: Normal breath sounds.  Abdominal:     Tenderness: There is no right CVA tenderness or left CVA tenderness.  Musculoskeletal:     Cervical back: Neck supple.  Skin:    General: Skin is warm and dry.  Neurological:     Mental Status: She is alert and oriented to person, place, and time.  Psychiatric:        Mood and Affect: Mood normal.           Assessment & Plan:  Urinary frequency Assessment & Plan: Fortunately, symptoms have resolved.  Urinalysis today with 2+ blood, otherwise unremarkable. Urine culture and urine microscopic ordered and pending.  Await results.   Orders: -     POCT Urinalysis Dipstick (Automated) -     Urine Culture -     Urine Microscopic        Gabriel John, NP

## 2024-01-08 NOTE — Patient Instructions (Signed)
 We will be in touch once we receive the extra urine tests.  Ensure you are consuming 64 ounces of water daily.  It was a pleasure to see you today!

## 2024-01-09 LAB — URINALYSIS, MICROSCOPIC ONLY
Hyaline Cast: NONE SEEN /LPF
WBC, UA: NONE SEEN /HPF (ref 0–5)

## 2024-01-09 LAB — URINE CULTURE
MICRO NUMBER:: 16484668
Result:: NO GROWTH
SPECIMEN QUALITY:: ADEQUATE

## 2024-01-10 ENCOUNTER — Ambulatory Visit: Payer: Self-pay | Admitting: Primary Care

## 2024-01-10 DIAGNOSIS — R3129 Other microscopic hematuria: Secondary | ICD-10-CM

## 2024-01-31 ENCOUNTER — Other Ambulatory Visit: Payer: Self-pay | Admitting: Primary Care

## 2024-01-31 DIAGNOSIS — Z1231 Encounter for screening mammogram for malignant neoplasm of breast: Secondary | ICD-10-CM

## 2024-02-06 ENCOUNTER — Ambulatory Visit
Admission: RE | Admit: 2024-02-06 | Discharge: 2024-02-06 | Disposition: A | Discharge status: Home / Self Care | Source: Ambulatory Visit | Attending: Primary Care | Admitting: Primary Care

## 2024-02-06 DIAGNOSIS — Z1231 Encounter for screening mammogram for malignant neoplasm of breast: Secondary | ICD-10-CM | POA: Insufficient documentation

## 2024-02-10 ENCOUNTER — Ambulatory Visit: Payer: Self-pay | Admitting: Primary Care

## 2024-02-14 ENCOUNTER — Ambulatory Visit: Admitting: Urology

## 2024-03-13 ENCOUNTER — Encounter: Payer: Self-pay | Admitting: Urology

## 2024-03-13 ENCOUNTER — Ambulatory Visit: Admitting: Urology

## 2024-03-13 VITALS — BP 147/64 | HR 112 | Ht 62.0 in | Wt 135.0 lb

## 2024-03-13 DIAGNOSIS — R3121 Asymptomatic microscopic hematuria: Secondary | ICD-10-CM | POA: Diagnosis not present

## 2024-03-13 LAB — URINALYSIS, COMPLETE
Bilirubin, UA: NEGATIVE
Glucose, UA: NEGATIVE
Ketones, UA: NEGATIVE
Leukocytes,UA: NEGATIVE
Nitrite, UA: NEGATIVE
Protein,UA: NEGATIVE
Specific Gravity, UA: 1.02 (ref 1.005–1.030)
Urobilinogen, Ur: 0.2 mg/dL (ref 0.2–1.0)
pH, UA: 6 (ref 5.0–7.5)

## 2024-03-13 LAB — MICROSCOPIC EXAMINATION

## 2024-03-13 NOTE — Progress Notes (Signed)
 I, Maysun LITTIE Griffiths, acting as a scribe for Glendia JAYSON Barba, MD., have documented all relevant documentation on the behalf of Glendia JAYSON Barba, MD, as directed by Glendia JAYSON Barba, MD while in the presence of Glendia JAYSON Barba, MD.  03/13/2024 9:25 PM   Adria Fellows [birth year] [phone]  Referring provider: Gretta Comer POUR, NP [address] [place],  [place] [address]  Chief Complaint  Patient presents with   Hematuria    HPI: Hannah Richards is a 63 y.o. female referred for evaluation of microhematuria.   PCP visit 01/08/24 for urinary frequency. Urinalysis showed 2+ blood on dipstick and positive protein. Microscopy remarkable for 3-10 RBC without significant squamous epithelial cells. Urine culture was negative. She presently has no bothersome lower urinary tract symptoms including frequency, urgency, or urinary incontinence. She denies gross hematuria.  She denies flank, abdominal, or pelvic pain.  No previous tobacco use. No prior history of urologic problems or prior urologic evaluation.   PMH: Past Medical History:  Diagnosis Date   Seizure disorder ([facility])    Last one in 1994   Seizures ([facility])    last one 20 years ago    Surgical History: Past Surgical History:  Procedure Laterality Date   CESAREAN SECTION     COLONOSCOPY WITH PROPOFOL  N/A 01/22/2017   Procedure: COLONOSCOPY WITH PROPOFOL ;  Surgeon: Jinny Carmine, MD;  Location: [facility] ENDOSCOPY;  Service: Endoscopy;  Laterality: N/A;   CYSTECTOMY Right    Right ear     Home Medications:  Allergies as of 03/13/2024       Reactions   Peanut-containing Drug Products Hives   Penicillins Hives        Medication List        Accurate as of March 13, 2024  9:25 PM. If you have any questions, ask your nurse or doctor.          CALCIUM 1200+D3 PO Take 600 mg by mouth daily.   D3 ADULT PO Take by mouth.   HM Flaxseed Oil 1000 MG Caps Take by mouth.   multivitamin tablet Take 1 tablet by mouth daily.    pyridoxine 100 MG tablet Commonly known as: B-6 Take 100 mg by mouth daily.        Allergies:  Allergies  Allergen Reactions   Peanut-Containing Drug Products Hives   Penicillins Hives    Family History: Family History  Problem Relation Age of Onset   Hypertension Mother    Stroke Mother    Dementia Mother    Diabetes Maternal Grandmother    Breast cancer Neg Hx     Social History:  reports that she has never smoked. She has never used smokeless tobacco. She reports that she does not drink alcohol and does not use drugs.   Physical Exam: BP (!) 147/64   Pulse (!) 112   Ht 5' 2 (1.575 m)   Wt 135 lb (61.2 kg)   LMP 01/22/2014 (Approximate)   BMI 24.69 kg/m   Constitutional:  Alert and oriented, No acute distress. HEENT: [place] AT Respiratory: Normal respiratory effort, no increased work of breathing. Psychiatric: Normal mood and affect.   Urinalysis Dipstick 1+ blood/microscopy 11-30 RBC. No significant squamous epithelial cells.    Assessment & Plan:    1. Asymptomatic microhematuria AUA risk stratification: high (based on age). We discussed the recommended evaluation of high-risk hematuria, which includes CT urogram for upper tract evaluation and cystoscopy for lower tract evaluation.  Both procedures were  discussed in detail.  All questions were answered, and she desires to proceed with further evaluation.  I have reviewed the above documentation for accuracy and completeness, and I agree with the above.   Glendia JAYSON Barba, MD  [facility] [address], [address] [place], [place] [address] [phone]

## 2024-03-13 NOTE — Patient Instructions (Addendum)
 Scheduling number:  [phone]  Cystoscopy Cystoscopy is a procedure that is used to help diagnose and sometimes treat conditions that affect the lower urinary tract. The lower urinary tract includes the bladder and the urethra. The urethra is the tube that drains urine from the bladder. Cystoscopy is done using a thin, tube-shaped instrument with a light and camera at the end (cystoscope). The cystoscope may be hard or flexible, depending on the goal of the procedure. The cystoscope is inserted through the urethra, into the bladder. Cystoscopy may be recommended if you have: Urinary tract infections that keep coming back. Blood in the urine (hematuria). An inability to control when you urinate (urinary incontinence) or an overactive bladder. Unusual cells found in a urine sample. A blockage in the urethra, such as a urinary stone. Painful urination. An abnormality in the bladder found during an intravenous pyelogram (IVP) or CT scan. What are the risks? Generally, this is a safe procedure. However, problems may occur, including: Infection. Bleeding.  What happens during the procedure?  You will be given one or more of the following: A medicine to numb the area (local anesthetic). The area around the opening of your urethra will be cleaned. The cystoscope will be passed through your urethra into your bladder. Germ-free (sterile) fluid will flow through the cystoscope to fill your bladder. The fluid will stretch your bladder so that your health care provider can clearly examine your bladder walls. Your doctor will look at the urethra and bladder. The cystoscope will be removed The procedure may vary among health care providers  What can I expect after the procedure? After the procedure, it is common to have: Some soreness or pain in your urethra. Urinary symptoms. These include: Mild pain or burning when you urinate. Pain should stop within a few minutes after you urinate. This may  last for up to a few days after the procedure. A small amount of blood in your urine for several days. Feeling like you need to urinate but producing only a small amount of urine. Follow these instructions at home: General instructions Return to your normal activities as told by your health care provider.  Drink plenty of fluids after the procedure. Keep all follow-up visits as told by your health care provider. This is important. Contact a health care provider if you: Have pain that gets worse or does not get better with medicine, especially pain when you urinate lasting longer than 72 hours after the procedure. Have trouble urinating. Get help right away if you: Have blood clots in your urine. Have a fever or chills. Are unable to urinate. Summary Cystoscopy is a procedure that is used to help diagnose and sometimes treat conditions that affect the lower urinary tract. Cystoscopy is done using a thin, tube-shaped instrument with a light and camera at the end. After the procedure, it is common to have some soreness or pain in your urethra. It is normal to have blood in your urine after the procedure.  If you were prescribed an antibiotic medicine, take it as told by your health care provider.  This information is not intended to replace advice given to you by your health care provider. Make sure you discuss any questions you have with your health care provider. Document Revised: 07/29/2018 Document Reviewed: 07/29/2018 Elsevier Patient Education  2020 [employer].

## 2024-04-03 ENCOUNTER — Ambulatory Visit

## 2024-04-07 ENCOUNTER — Encounter: Payer: Self-pay | Admitting: Urology

## 2024-04-14 ENCOUNTER — Ambulatory Visit
Admission: RE | Admit: 2024-04-14 | Discharge: 2024-04-14 | Disposition: A | Discharge status: Home / Self Care | Source: Ambulatory Visit | Attending: Urology | Admitting: Urology

## 2024-04-14 DIAGNOSIS — R3121 Asymptomatic microscopic hematuria: Secondary | ICD-10-CM | POA: Diagnosis present

## 2024-04-14 MED ORDER — IOHEXOL 300 MG/ML  SOLN
100.0000 mL | Freq: Once | INTRAMUSCULAR | Status: AC | PRN
  Administered 2024-04-14: 100 mL via INTRAVENOUS

## 2024-04-15 ENCOUNTER — Ambulatory Visit: Admitting: Urology

## 2024-04-15 VITALS — BP 166/75 | HR 72 | Ht 62.0 in | Wt 135.0 lb

## 2024-04-15 DIAGNOSIS — R3129 Other microscopic hematuria: Secondary | ICD-10-CM | POA: Diagnosis not present

## 2024-04-15 LAB — MICROSCOPIC EXAMINATION

## 2024-04-15 LAB — URINALYSIS, COMPLETE
Bilirubin, UA: NEGATIVE
Glucose, UA: NEGATIVE
Leukocytes,UA: NEGATIVE
Nitrite, UA: NEGATIVE
Protein,UA: NEGATIVE
Specific Gravity, UA: 1.03 (ref 1.005–1.030)
Urobilinogen, Ur: 0.2 mg/dL (ref 0.2–1.0)
pH, UA: 6 (ref 5.0–7.5)

## 2024-04-15 NOTE — Progress Notes (Signed)
   04/15/24  CC:  Chief Complaint  Patient presents with   Cysto    HPI: Refer to my prior office note 03/13/2024.  CT urogram performed yesterday has not been interpreted by radiology.  Images were reviewed and no significant GU abnormalities noted.  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  Pelvic phleboliths present but no definite ureteral calculus.  UA today dipstick 2+ blood/trace ketones microscopy 3-10 RBC  Blood pressure (!) 166/75, pulse 72, height 5' 2 (1.575 m), weight 135 lb (61.2 kg), last menstrual period 01/22/2014. NED. A&Ox3.   No respiratory distress   Abd soft, NT, ND Normal external genitalia with patent urethral meatus  Cystoscopy Procedure Note  Patient identification was confirmed, informed consent was obtained, and patient was prepped using Betadine solution.  Lidocaine  jelly was administered per urethral meatus.    Procedure: - Flexible cystoscope introduced, without any difficulty.   - Thorough search of the bladder revealed:    normal urethral meatus    normal urothelium    no stones    no ulcers     no tumors    no urethral polyps    no trabeculation  - Ureteral orifices were normal in position and appearance.  Post-Procedure: - Patient tolerated the procedure well  Assessment/ Plan: She will be notified once CT urogram radiology report is read 1 year follow-up with [facility]   Glendia JAYSON Barba, MD

## 2024-04-21 ENCOUNTER — Ambulatory Visit: Payer: Self-pay | Admitting: Urology

## 2024-07-01 ENCOUNTER — Ambulatory Visit: Payer: Self-pay | Admitting: Primary Care

## 2024-07-01 ENCOUNTER — Ambulatory Visit: Admitting: Primary Care

## 2024-07-01 ENCOUNTER — Other Ambulatory Visit (HOSPITAL_COMMUNITY)
Admission: RE | Admit: 2024-07-01 | Discharge: 2024-07-01 | Disposition: A | Discharge status: Home / Self Care | Source: Ambulatory Visit | Attending: Physician Assistant | Admitting: Physician Assistant

## 2024-07-01 ENCOUNTER — Encounter: Payer: Self-pay | Admitting: Primary Care

## 2024-07-01 VITALS — BP 138/78 | HR 67 | Temp 98.0°F | Ht 61.5 in | Wt 139.5 lb

## 2024-07-01 DIAGNOSIS — N949 Unspecified condition associated with female genital organs and menstrual cycle: Secondary | ICD-10-CM

## 2024-07-01 DIAGNOSIS — Z124 Encounter for screening for malignant neoplasm of cervix: Secondary | ICD-10-CM | POA: Insufficient documentation

## 2024-07-01 DIAGNOSIS — Z0000 Encounter for general adult medical examination without abnormal findings: Secondary | ICD-10-CM

## 2024-07-01 DIAGNOSIS — E785 Hyperlipidemia, unspecified: Secondary | ICD-10-CM | POA: Diagnosis not present

## 2024-07-01 DIAGNOSIS — G40909 Epilepsy, unspecified, not intractable, without status epilepticus: Secondary | ICD-10-CM

## 2024-07-01 LAB — COMPREHENSIVE METABOLIC PANEL WITH GFR
ALT: 15 U/L (ref 0–35)
AST: 17 U/L (ref 0–37)
Albumin: 4.3 g/dL (ref 3.5–5.2)
Alkaline Phosphatase: 63 U/L (ref 39–117)
BUN: 15 mg/dL (ref 6–23)
CO2: 29 meq/L (ref 19–32)
Calcium: 9.3 mg/dL (ref 8.4–10.5)
Chloride: 105 meq/L (ref 96–112)
Creatinine, Ser: 0.79 mg/dL (ref 0.40–1.20)
GFR: 79.15 mL/min (ref >60.00)
Glucose, Bld: 81 mg/dL (ref 70–99)
Potassium: 4 meq/L (ref 3.5–5.1)
Sodium: 141 meq/L (ref 135–145)
Total Bilirubin: 0.6 mg/dL (ref 0.2–1.2)
Total Protein: 7.2 g/dL (ref 6.0–8.3)

## 2024-07-01 LAB — LIPID PANEL
Cholesterol: 168 mg/dL (ref 0–200)
HDL: 48 mg/dL (ref >39.00)
LDL Cholesterol: 109 mg/dL — ABNORMAL HIGH (ref 0–99)
NonHDL: 120.12
Total CHOL/HDL Ratio: 4
Triglycerides: 55 mg/dL (ref 0.0–149.0)
VLDL: 11 mg/dL (ref 0.0–40.0)

## 2024-07-01 NOTE — Progress Notes (Signed)
 Subjective:    Patient ID: Hannah Richards, female    DOB: [birth year], 63 y.o.   MRN: [id number]  Hannah Richards is a very pleasant 63 y.o. female who presents today for complete physical and follow up of chronic conditions.  Immunizations: -Tetanus: Completed in 2017 -Influenza: Completed this season  -Shingles: Completed Shingrix  series   Diet: Fair diet.  Exercise: Regular exercise.  Eye exam: Completes annually  Dental exam: Completes semi-annually    Pap Smear: Completed in December 2021 Mammogram: Completed in June 2025   Colonoscopy: Completed in 2018, due 2028  BP Readings from Last 3 Encounters:  07/01/24 138/78  04/15/24 (!) 166/75  03/13/24 (!) 147/64        Review of Systems  Constitutional:  Negative for unexpected weight change.  HENT:  Negative for rhinorrhea.   Respiratory:  Negative for cough and shortness of breath.   Cardiovascular:  Negative for chest pain.  Gastrointestinal:  Negative for constipation and diarrhea.  Genitourinary:  Negative for difficulty urinating.  Musculoskeletal:  Negative for arthralgias and myalgias.  Skin:  Negative for rash.  Allergic/Immunologic: Negative for environmental allergies.  Neurological:  Negative for dizziness and headaches.  Psychiatric/Behavioral:  The patient is not nervous/anxious.          Past Medical History:  Diagnosis Date   Seizure disorder (HCC)    Last one in 1994   Seizures (HCC)    last one 20 years ago    Social History   Socioeconomic History   Marital status: Married    Spouse name: Not on file   Number of children: Not on file   Years of education: Not on file   Highest education level: Master's degree (e.g., MA, MS, MEng, MEd, MSW, MBA)  Occupational History   Not on file  Tobacco Use   Smoking status: Never   Smokeless tobacco: Never  Vaping Use   Vaping status: Never Used  Substance and Sexual Activity   Alcohol use: No   Drug use: No   Sexual activity: Yes     Partners: Male    Birth control/protection: Post-menopausal, None  Other Topics Concern   Not on file  Social History Narrative   Moved from [place]  to care for her mother.   Married.   2 daughters.   Highest level of education [employer].   Works doing taxes.    Enjoys exercising, dance, painting.   Social Drivers of Corporate Investment Banker Strain: Low Risk  (06/28/2024)   Overall Financial Resource Strain (CARDIA)    Difficulty of Paying Living Expenses: Not very hard  Food Insecurity: No Food Insecurity (06/28/2024)   Hunger Vital Sign    Worried About Running Out of Food in the Last Year: Never true    Ran Out of Food in the Last Year: Never true  Transportation Needs: No Transportation Needs (06/28/2024)   PRAPARE - Administrator, Civil Service (Medical): No    Lack of Transportation (Non-Medical): No  Physical Activity: Sufficiently Active (06/28/2024)   Exercise Vital Sign    Days of Exercise per Week: 3 days    Minutes of Exercise per Session: 60 min  Stress: Stress Concern Present (06/28/2024)   [employer] of Occupational Health - Occupational Stress Questionnaire    Feeling of Stress: To some extent  Social Connections: Socially Integrated (06/28/2024)   Social Connection and Isolation Panel    Frequency of Communication with Friends and Family: More than three times  a week    Frequency of Social Gatherings with Friends and Family: More than three times a week    Attends Religious Services: More than 4 times per year    Active Member of Clubs or Organizations: Yes    Attends Banker Meetings: More than 4 times per year    Marital Status: Married  Catering Manager Violence: Not on file    Past Surgical History:  Procedure Laterality Date   CESAREAN SECTION     COLONOSCOPY WITH PROPOFOL  N/A 01/22/2017   Procedure: COLONOSCOPY WITH PROPOFOL ;  Surgeon: Jinny Carmine, MD;  Location: [facility] ENDOSCOPY;  Service: Endoscopy;   Laterality: N/A;   CYSTECTOMY Right    Right ear    EYE SURGERY  laser correction    Family History  Problem Relation Age of Onset   Hypertension Mother    Stroke Mother    Dementia Mother    Diabetes Maternal Grandmother    Breast cancer Neg Hx     Allergies  Allergen Reactions   Peanut-Containing Drug Products Hives   Penicillins Hives    Current Outpatient Medications on File Prior to Visit  Medication Sig Dispense Refill   Calcium-Magnesium-Vitamin D  (CALCIUM 1200+D3 PO) Take 600 mg by mouth daily.     Cholecalciferol (D3 ADULT PO) Take by mouth.     Multiple Vitamin (MULTIVITAMIN) tablet Take 1 tablet by mouth daily.     pyridoxine (B-6) 100 MG tablet Take 100 mg by mouth daily.     No current facility-administered medications on file prior to visit.    BP 138/78   Pulse 67   Temp 98 F (36.7 C) (Oral)   Ht 5' 1.5 (1.562 m)   Wt 139 lb 8 oz (63.3 kg)   LMP 01/22/2014 (Approximate)   SpO2 98%   BMI 25.93 kg/m  Objective:   Physical Exam HENT:     Right Ear: Tympanic membrane and ear canal normal.     Left Ear: Tympanic membrane and ear canal normal.  Eyes:     Pupils: Pupils are equal, round, and reactive to light.  Cardiovascular:     Rate and Rhythm: Normal rate and regular rhythm.  Pulmonary:     Effort: Pulmonary effort is normal.     Breath sounds: Normal breath sounds.  Abdominal:     General: Bowel sounds are normal.     Palpations: Abdomen is soft.     Tenderness: There is no abdominal tenderness.  Musculoskeletal:        General: Normal range of motion.     Cervical back: Neck supple.  Skin:    General: Skin is warm and dry.  Neurological:     Mental Status: She is alert and oriented to person, place, and time.     Cranial Nerves: No cranial nerve deficit.     Deep Tendon Reflexes:     Reflex Scores:      Patellar reflexes are 2+ on the right side and 2+ on the left side. Psychiatric:        Mood and Affect: Mood normal.      Physical Exam        Assessment & Plan:  Hyperlipidemia, unspecified hyperlipidemia type Assessment & Plan: Repeat lipid panel pending.   Orders: -     Comprehensive metabolic panel with GFR -     Lipid panel  Screening for cervical cancer -     Cytology - PAP  Preventative health care Assessment & Plan: Immunizations  UTD. Pap smear due, completed today Mammogram UTD Colonoscopy UTD, due 2028  Discussed the importance of a healthy diet and regular exercise in order for weight loss, and to reduce the risk of further co-morbidity.  Exam stable. Labs pending.  Follow up in 1 year for repeat physical.    Adnexal cyst Assessment & Plan: Incidental finding on CT scan from August 2025. Reviewed scan with the patient and she continues to decline follow-up transvaginal/pelvic ultrasound.   Seizure disorder [facility]) Assessment & Plan: No seizures in years.  Continue to monitor      Assessment and Plan Assessment & Plan         Comer MARLA Gaskins, NP    History of Present Illness

## 2024-07-01 NOTE — Assessment & Plan Note (Signed)
 Repeat lipid panel pending.

## 2024-07-01 NOTE — Patient Instructions (Signed)
 Stop by the lab prior to leaving today. I will notify you of your results once received.   It was a pleasure to see you today!

## 2024-07-01 NOTE — Assessment & Plan Note (Signed)
No seizures in years.  Continue to monitor.

## 2024-07-01 NOTE — Assessment & Plan Note (Signed)
 Incidental finding on CT scan from August 2025. Reviewed scan with the patient and she continues to decline follow-up transvaginal/pelvic ultrasound.

## 2024-07-01 NOTE — Assessment & Plan Note (Signed)
 Immunizations UTD. Pap smear due, completed today Mammogram UTD Colonoscopy UTD, due 2028  Discussed the importance of a healthy diet and regular exercise in order for weight loss, and to reduce the risk of further co-morbidity.  Exam stable. Labs pending.  Follow up in 1 year for repeat physical.

## 2024-07-02 LAB — CYTOLOGY - PAP
Adequacy: ABSENT
Comment: NEGATIVE
Diagnosis: NEGATIVE
High risk HPV: NEGATIVE
